# Patient Record
Sex: Female | Born: 1945 | Race: Black or African American | Hispanic: No | Smoking: Former smoker
Health system: Southern US, Community
[De-identification: ages and names within clinical notes are randomized; demographics above are authoritative.]

## PROBLEM LIST (undated history)

## (undated) DIAGNOSIS — K859 Acute pancreatitis without necrosis or infection, unspecified: Secondary | ICD-10-CM

## (undated) DIAGNOSIS — E785 Hyperlipidemia, unspecified: Secondary | ICD-10-CM

## (undated) DIAGNOSIS — E119 Type 2 diabetes mellitus without complications: Secondary | ICD-10-CM

## (undated) DIAGNOSIS — I10 Essential (primary) hypertension: Secondary | ICD-10-CM

## (undated) DIAGNOSIS — F419 Anxiety disorder, unspecified: Secondary | ICD-10-CM

## (undated) DIAGNOSIS — D649 Anemia, unspecified: Secondary | ICD-10-CM

## (undated) DIAGNOSIS — M1990 Unspecified osteoarthritis, unspecified site: Secondary | ICD-10-CM

## (undated) HISTORY — PX: OTHER SURGICAL HISTORY: SHX169

## (undated) HISTORY — DX: Type 2 diabetes mellitus without complications: E11.9

---

## 2001-03-01 ENCOUNTER — Encounter: Payer: Self-pay | Admitting: Obstetrics and Gynecology

## 2001-03-01 ENCOUNTER — Ambulatory Visit (HOSPITAL_COMMUNITY): Admission: RE | Admit: 2001-03-01 | Discharge: 2001-03-01 | Payer: Self-pay | Admitting: Obstetrics and Gynecology

## 2003-02-01 ENCOUNTER — Encounter: Payer: Self-pay | Admitting: Specialist

## 2003-02-01 ENCOUNTER — Ambulatory Visit (HOSPITAL_COMMUNITY): Admission: RE | Admit: 2003-02-01 | Discharge: 2003-02-01 | Payer: Self-pay | Admitting: Specialist

## 2006-10-28 ENCOUNTER — Ambulatory Visit: Payer: Self-pay | Admitting: General Surgery

## 2007-06-01 ENCOUNTER — Ambulatory Visit: Payer: Self-pay | Admitting: Family Medicine

## 2010-02-08 ENCOUNTER — Inpatient Hospital Stay: Payer: Self-pay | Admitting: Internal Medicine

## 2010-06-27 ENCOUNTER — Ambulatory Visit: Payer: Self-pay | Admitting: Family Medicine

## 2011-05-26 ENCOUNTER — Ambulatory Visit: Payer: Self-pay

## 2011-05-26 IMAGING — CT CT ABD-PELV W/ CM
1 of 2 series · 15 of 32 positions shown, 19 images · non-contrast
Comparison: none

REASON FOR EXAM: (1) abd pain; (2) pel pain
COMMENTS:

PROCEDURE:     CT  - CT ABDOMEN / PELVIS  W  - February 08, 2010  [DATE]
RESULT:
TECHNIQUE: Helical 3 mm sections were obtained from the lung bases through
the pubic symphysis status post intravenous administration of 100 mL of
Wsovue-FYO. The patient also received oral contrast.

[Series 2: 3mm soft tissue · axial · 0.70mm/px · z∈[-1172,-728]mm · 15 of 162 slices shown, 19 images]
[im 7/162  soft-tissue]
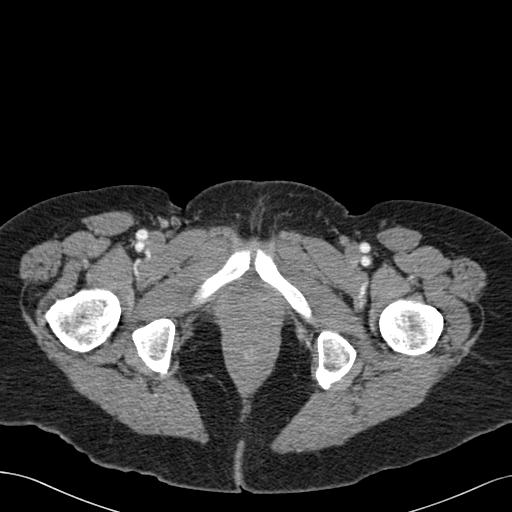
[im 7/162  bone]
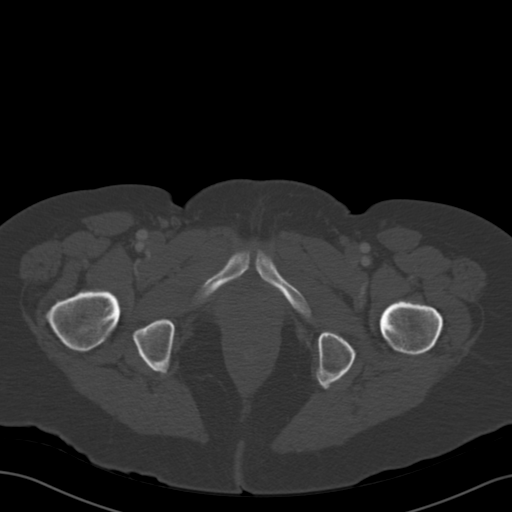
[im 20/162  soft-tissue]
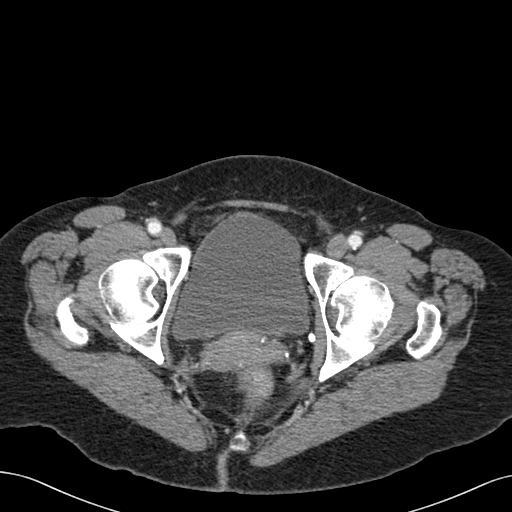
[im 33/162  soft-tissue]
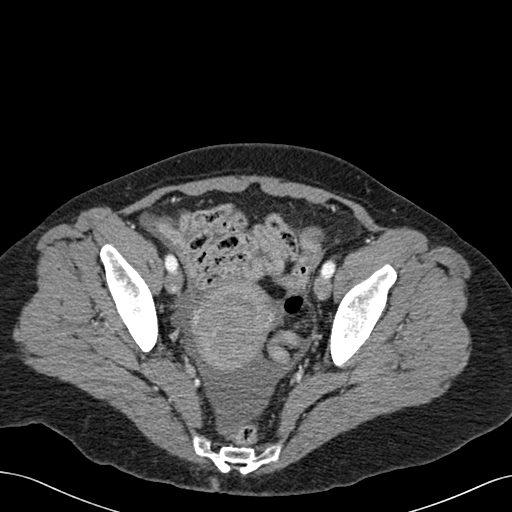
[im 46/162  soft-tissue]
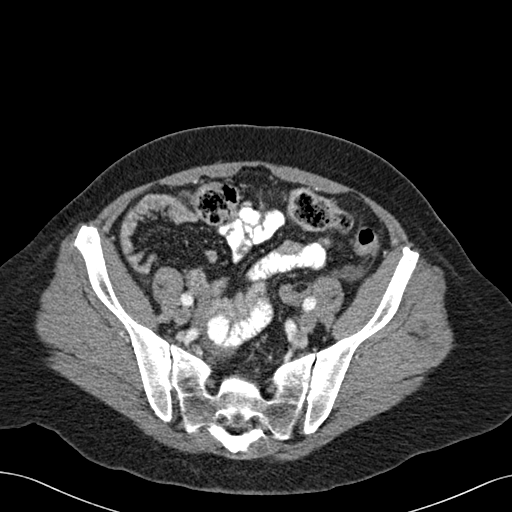
[im 58/162  soft-tissue]
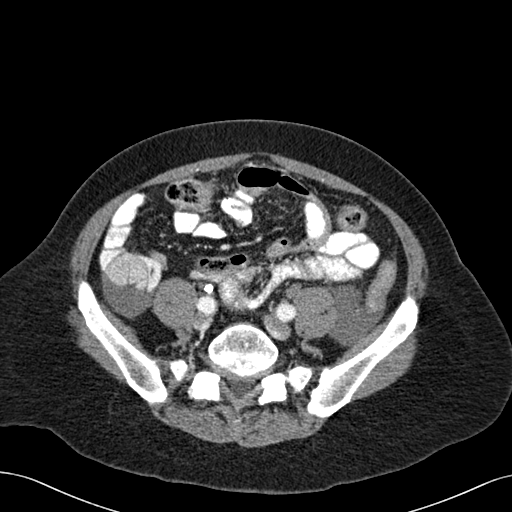
[im 71/162  soft-tissue]
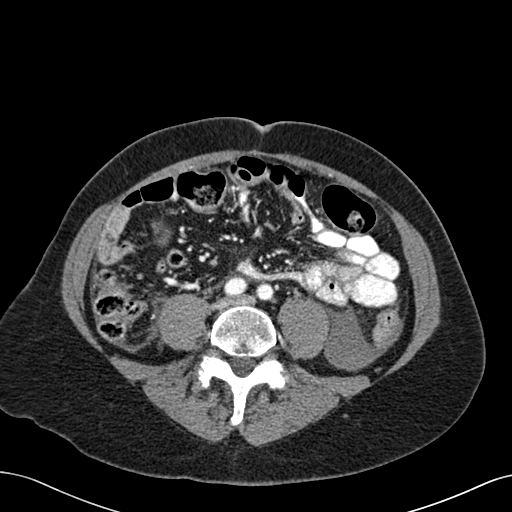
[im 84/162  soft-tissue]
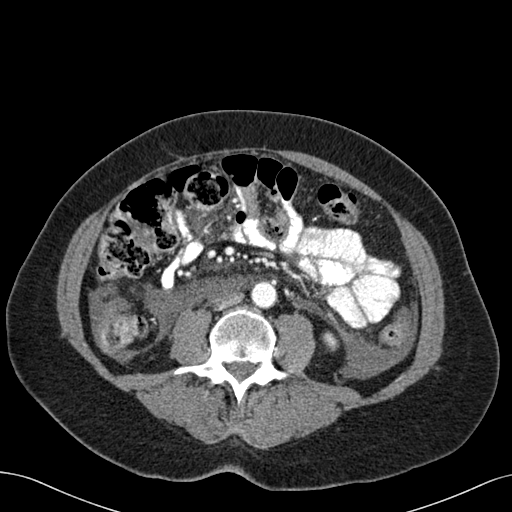
[im 91/162  soft-tissue]
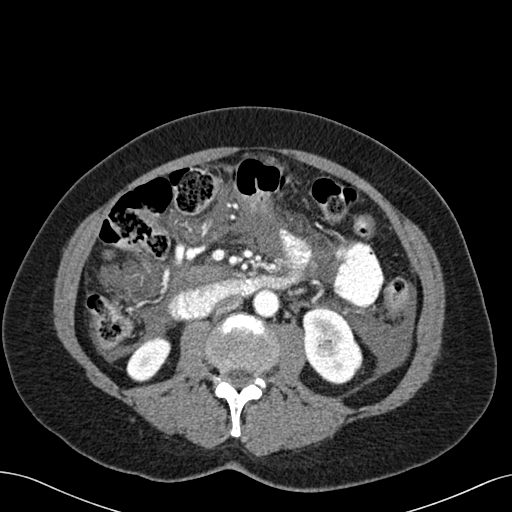
[im 104/162  soft-tissue]
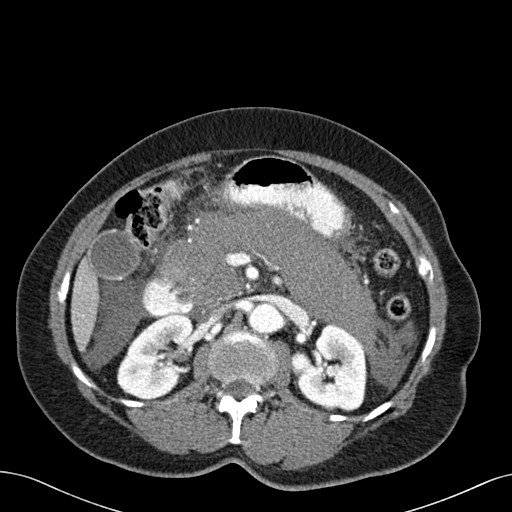
[im 104/162  bone]
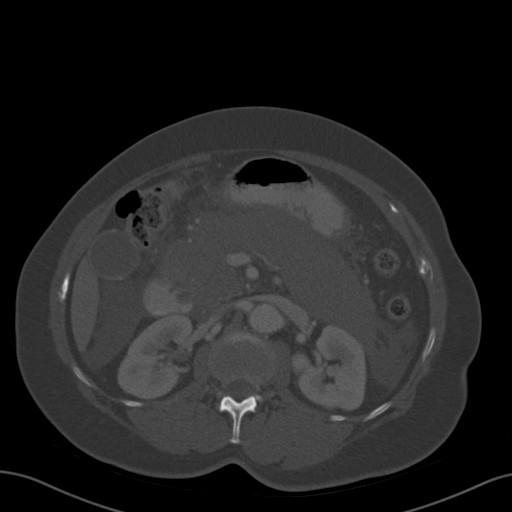
[im 116/162  soft-tissue]
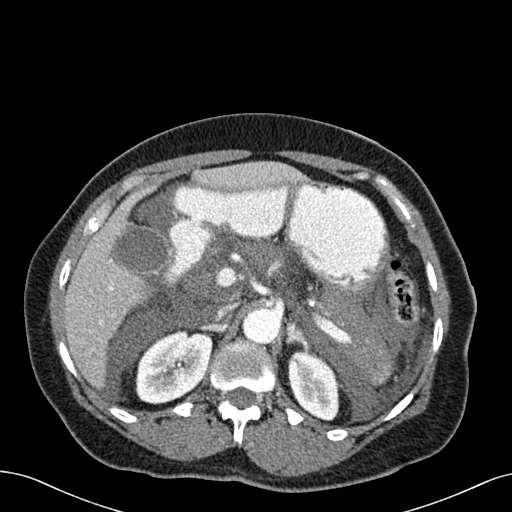
[im 129/162  soft-tissue]
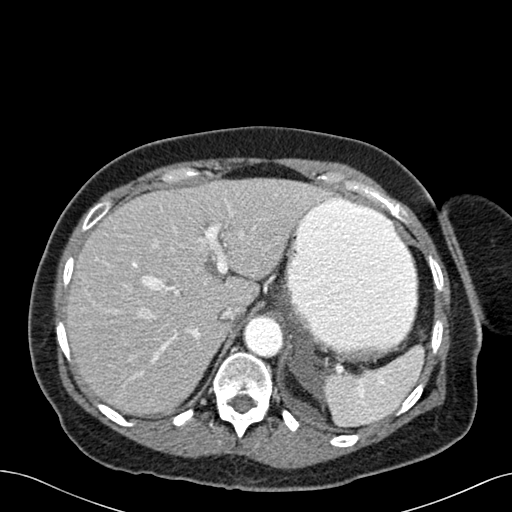
[im 136/162  lung]
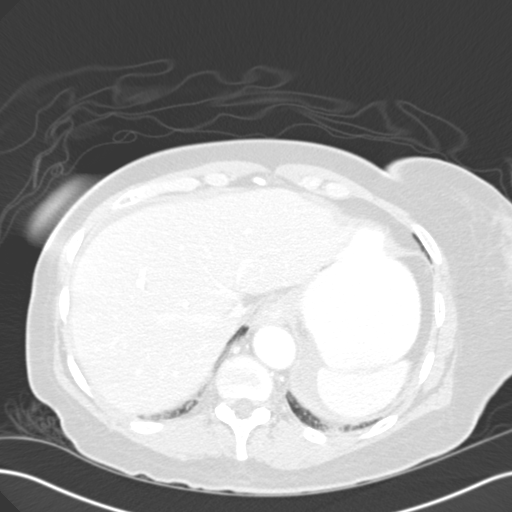
[im 142/162  soft-tissue]
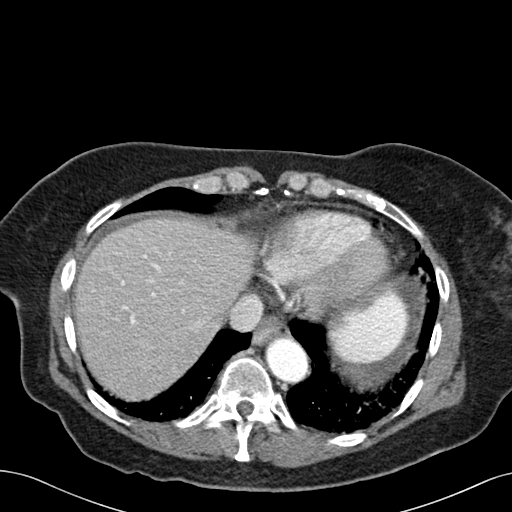
[im 142/162  lung]
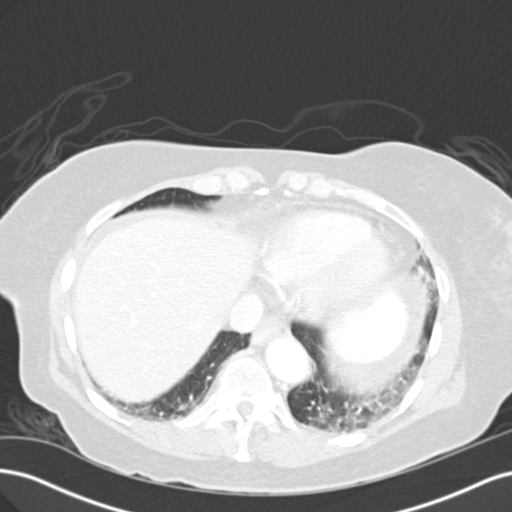
[im 149/162  lung]
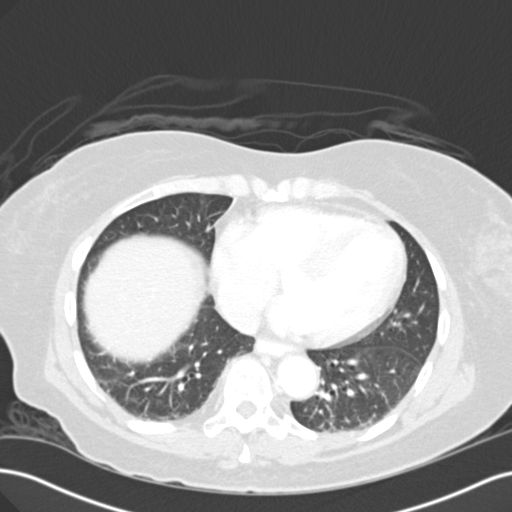
[im 155/162  soft-tissue]
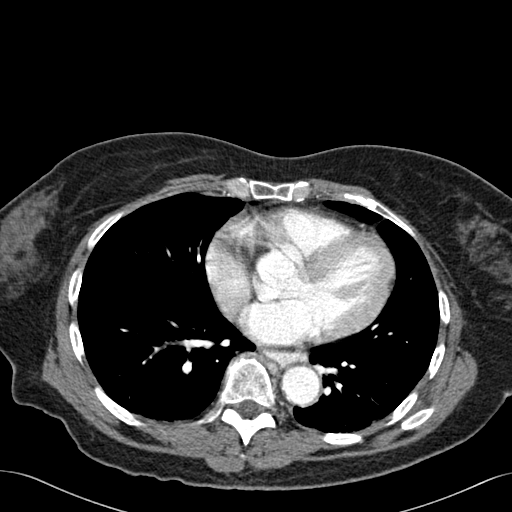
[im 155/162  lung]
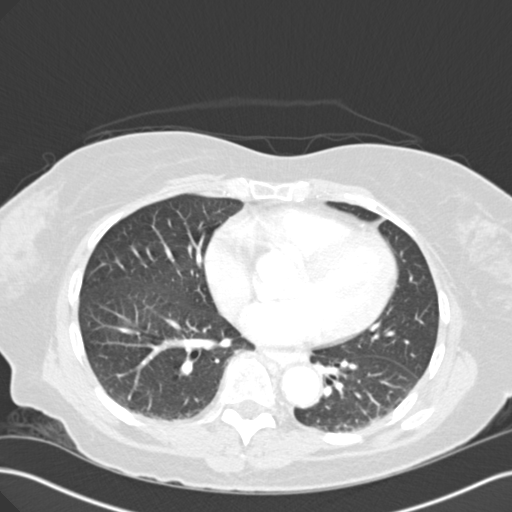

[15 of 32 positions shown; findings below may reference images not displayed]

FINDINGS: The lung bases demonstrate mild hyperinflation within the
posterior lung bases and otherwise grossly unremarkable.

Evaluation of the pancreas demonstrates diffuse enlargement of the pancreas
with loss of the normal undulating border. No focal drainable loculated
fluid collections are associated with the pancreas. There is diffuse
hypoattenuation throughout the pancreas. Diffuse free fluid is appreciated
surrounding the pancreas as well as areas of induration within the
peripancreatic fat. The fluid extends to the pelvic region and projects
within the cul-de-sac.

The liver, spleen, adrenals and kidneys are unremarkable. There is no
evidence of abdominal aortic aneurysm or dissection. The celiac, SMA, IMA,
portal vein and SMV are opacified. There does not appear to be evidence of
aneurysm formation within these vessels. There is no CT evidence of bowel
obstruction or secondary signs reflecting enteritis, colitis nor
diverticulitis. There is no evidence of appendicitis.
IMPRESSION: 1.     CT findings consistent with pancreatitis. There is hypo- enhancement
of the pancreas and an etiology such as evolving pancreatic necrosis cannot
be excluded. Surveillance evaluation recommended.
2.     There is no CT evidence of obstructive abnormalities or evidence of
pseudocyst formation.
3.     Dr. Zeephenx of the Emergency Department was informed of these
findings via preliminary fax report dated 02/08/10 at [DATE] p.m. CT.

## 2011-05-27 IMAGING — CR DG CHEST 1V PORT
1 series · 1 of 1 positions shown · non-contrast
Comparison: none

REASON FOR EXAM: Hypoxia
COMMENTS:

[view not recorded]
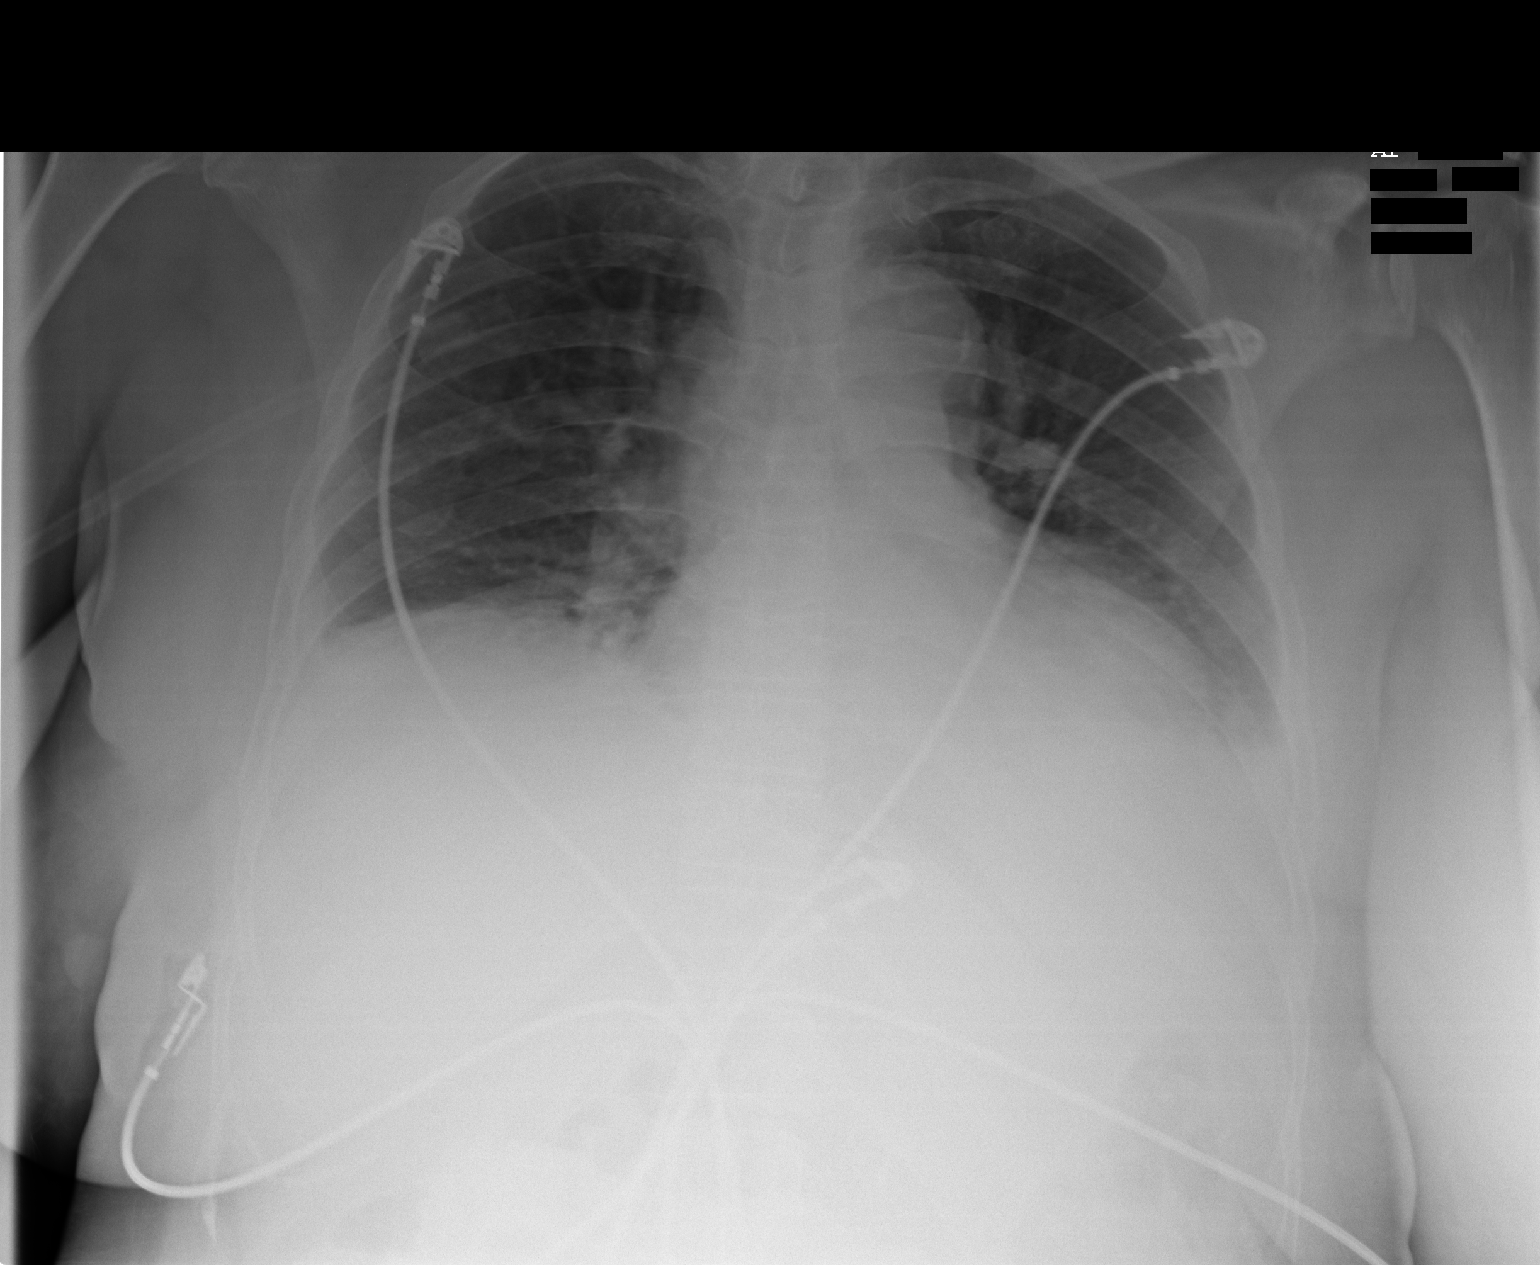

[1 of 1 positions shown; findings below may reference images not displayed]

PROCEDURE:     DXR - DXR PORTABLE CHEST SINGLE VIEW  - February 09, 2010 [DATE]

RESULT:     The inspiratory level is less than optimal. There is loss of
clarity of the left hemidiaphragm compatible with atelectasis and possibly a
trace, left effusion. The lung fields otherwise are clear. The heart size is
normal. The pulmonary vascularity is normal in appearance. Monitoring
electrodes are present.
IMPRESSION: There is thickening of the left basilar markings associated
with loss of clarity of the left hemidiaphragm. The findings are suspicious
for atelectasis and possibly a trace, left effusion.

## 2011-05-28 IMAGING — CR DG CHEST 1V PORT
1 series · 1 of 1 positions shown · non-contrast
Comparison: none

REASON FOR EXAM: line attempt
COMMENTS:

[view not recorded]
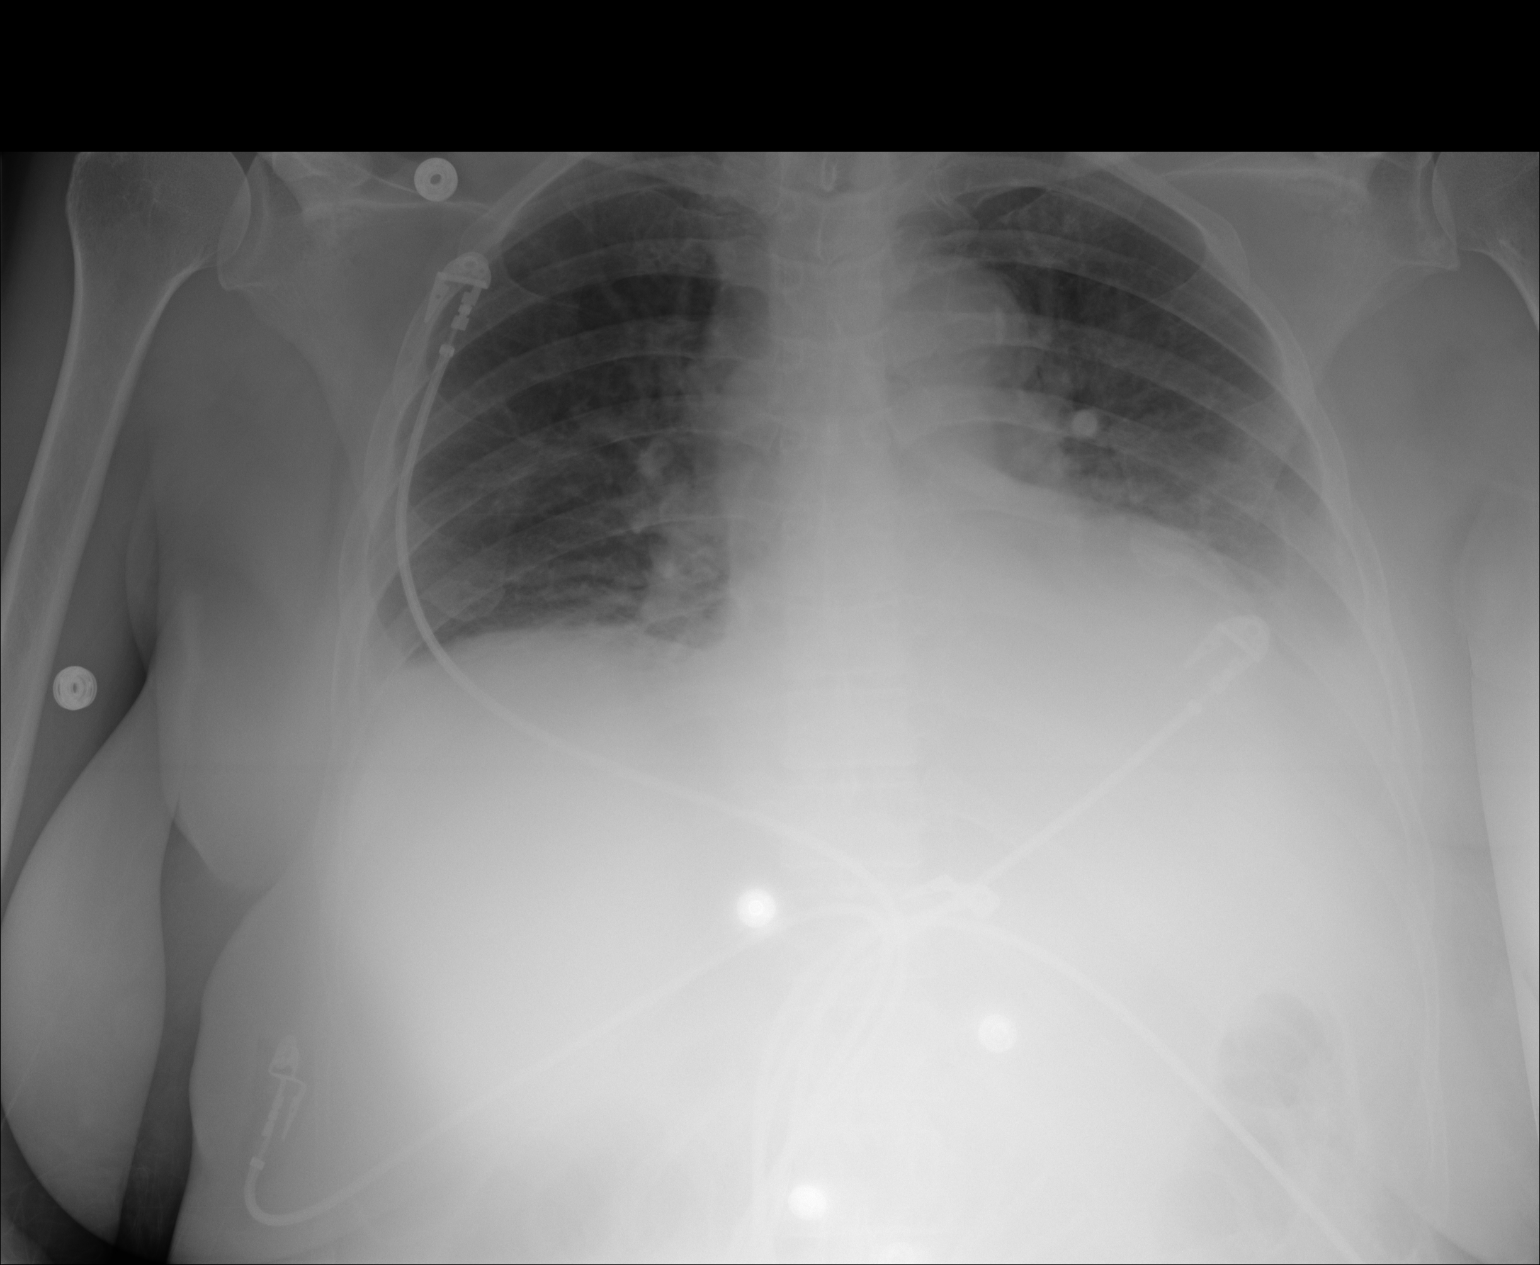

[1 of 1 positions shown; findings below may reference images not displayed]

PROCEDURE:     DXR - DXR PORTABLE CHEST SINGLE VIEW  - February 10, 2010  [DATE]

RESULT:     There is increased density at the left base, more prominent than
was evident on the exam of 02/09/2010. Pneumonia, atelectasis and effusion are
all considerations in the differential. Continued follow-up including a
lateral view is recommended when clinically feasible. The right lung field
remains clear. The heart size is within the limits of normal.
IMPRESSION: There has been a progressive increase in density at the left
base consistent with pneumonia, atelectasis or effusion. Continued follow-up
is recommended.

## 2011-06-06 ENCOUNTER — Ambulatory Visit: Payer: Self-pay

## 2013-08-31 ENCOUNTER — Ambulatory Visit (INDEPENDENT_AMBULATORY_CARE_PROVIDER_SITE_OTHER): Payer: Medicare Other | Admitting: Emergency Medicine

## 2013-08-31 VITALS — BP 132/86 | HR 63 | Temp 98.4°F | Resp 16 | Ht 67.0 in | Wt 168.0 lb

## 2013-08-31 DIAGNOSIS — H0019 Chalazion unspecified eye, unspecified eyelid: Secondary | ICD-10-CM

## 2013-08-31 DIAGNOSIS — E119 Type 2 diabetes mellitus without complications: Secondary | ICD-10-CM

## 2013-08-31 DIAGNOSIS — H0014 Chalazion left upper eyelid: Secondary | ICD-10-CM

## 2013-08-31 MED ORDER — OFLOXACIN 0.3 % OP SOLN: 1.0000 [drp] | OPHTHALMIC | Status: DC

## 2013-08-31 NOTE — Patient Instructions (Signed)
We will refer you to [facility] senior care You will also be referred to opthalmology we will call with these appointments Use warm compresses and eye drops Chalazion A chalazion is a swelling or hard lump on the eyelid caused by a blocked oil gland. Chalazions may occur on the upper or the lower eyelid.  CAUSES  Oil gland in the eyelid becomes blocked. SYMPTOMS   Swelling or hard lump on the eyelid. This lump may make it hard to see out of the eye.  The swelling may spread to areas around the eye. TREATMENT   Although some chalazions disappear by themselves in 1 or 2 months, some chalazions may need to be removed.  Medicines to treat an infection may be required. HOME CARE INSTRUCTIONS   Wash your hands often and dry them with a clean towel. Do not touch the chalazion.  Apply heat to the eyelid several times a day for 10 minutes to help ease discomfort and bring any yellowish white fluid (pus) to the surface. One way to apply heat to a chalazion is to use the handle of a metal spoon.  Hold the handle under hot water until it is hot, and then wrap the handle in paper towels so that the heat can come through without burning your skin.  Hold the wrapped handle against the chalazion and reheat the spoon handle as needed.  Apply heat in this fashion for 10 minutes, 4 times per day.  Return to your caregiver to have the pus removed if it does not break (rupture) on its own.  Do not try to remove the pus yourself by squeezing the chalazion or sticking it with a pin or needle.  Only take over-the-counter or prescription medicines for pain, discomfort, or fever as directed by your caregiver. SEEK IMMEDIATE MEDICAL CARE IF:   You have pain in your eye.  Your vision changes.  The chalazion does not go away.  The chalazion becomes painful, red, or swollen, grows larger, or does not start to disappear after 2 weeks. MAKE SURE YOU:   Understand these instructions.  Will watch your  condition.  Will get help right away if you are not doing well or get worse. Document Released: 09/18/2000 Document Revised: 12/14/2011 Document Reviewed: 01/06/2010 [facility] Patient Information 2014 [place], [place].

## 2013-08-31 NOTE — Progress Notes (Signed)
[facility] [address] [place] [address] [phone]  Date:  08/31/2013   Name:  HARBOR PASTER   DOB:  1946/05/30   MRN:  [id number]  PCP:  No PCP Per Patient    Chief Complaint: Annual Exam and needs labs   History of Present Illness:  Brooke Morse is a 67 y.o. very pleasant female patient who presents with the following:  Noted a progressively swelling mass on the left upper lid.  Does not affect vision.  No gluing or pain.  No visual complaints.  No history of injury.  No improvement with over the counter medications or other home remedies. Denies other complaint or health concern today.   There are no active problems to display for this patient.   Past Medical History  Diagnosis Date  . Diabetes mellitus without complication     Past Surgical History  Procedure Laterality Date  . Bunion removal      History  Substance Use Topics  . Smoking status: Never Smoker   . Smokeless tobacco: Not on file  . Alcohol Use: No    Family History  Problem Relation Age of Onset  . Cancer Father     No Known Allergies  Medication list has been reviewed and updated.  No current outpatient prescriptions on file prior to visit.   No current facility-administered medications on file prior to visit.    Review of Systems:  As per HPI, otherwise negative.    Physical Examination: Filed Vitals:   08/31/13 0947  BP: 132/86  Pulse: 63  Temp: 98.4 F (36.9 C)  Resp: 16   Filed Vitals:   08/31/13 0947  Height: 5\' 7"  (1.702 m)  Weight: 168 lb (76.204 kg)   Body mass index is 26.31 kg/(m^2). Ideal Body Weight: Weight in (lb) to have BMI = 25: 159.3   GEN: WDWN, NAD, Non-toxic, Alert & Oriented x 3 HEENT: Atraumatic, Normocephalic.  Ears and Nose: No external deformity. EXTR: No clubbing/cyanosis/edema NEURO: Normal gait.  PSYCH: Normally interactive. Conversant. Not depressed or anxious appearing.  Calm demeanor.  LEFT eye:  Upper  lid large chalazion   Assessment and Plan: Chalazion Follow up with opth [facility]   Signed,  Phillips Odor, MD

## 2013-09-04 ENCOUNTER — Ambulatory Visit: Payer: Self-pay | Admitting: Podiatry

## 2013-09-11 ENCOUNTER — Ambulatory Visit (INDEPENDENT_AMBULATORY_CARE_PROVIDER_SITE_OTHER): Payer: Medicare Other | Admitting: Podiatry

## 2013-09-11 ENCOUNTER — Encounter: Payer: Self-pay | Admitting: Podiatry

## 2013-09-11 VITALS — BP 138/80 | HR 67 | Resp 16 | Ht 67.0 in | Wt 165.0 lb

## 2013-09-11 DIAGNOSIS — M2010 Hallux valgus (acquired), unspecified foot: Secondary | ICD-10-CM

## 2013-09-11 DIAGNOSIS — M79609 Pain in unspecified limb: Secondary | ICD-10-CM

## 2013-09-11 NOTE — Progress Notes (Signed)
Brooke Morse presents today with a chief complaint of a painful bunion left. She relates that it is become more painful over the past several months and is beginning to inhibit her ability to perform her daily routine and continue her exercise regimen to control her diabetes hypertension and weight. She has tried conservative therapy such as wider shoes and anti-inflammatories to no avail.  Objective: Vital signs are stable she is alert and oriented x3. I have reviewed her past medical history medications and allergies. Vital signs are stable she is alert and oriented x3. Pulses are strongly palpable bilateral. Hallux abductovalgus deformity of the left foot is noted with an increase in the first intermetatarsal angle greater than is normal parameters as well as the hallux abductus angle. 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 I have reviewed her radiographs indicative of hallux abductovalgus deformity.  Assessment: Hallux valgus left.  Plan: We discussed the etiology pathology conservative versus surgical therapies at this point we are consenting her today for a an [facility] bunion repair with screw fixation left foot. I answered all the questions to regarding these procedures to the best of my ability is in layman's terms. She understood this was amenable to it signed Dr. pages of the consent form. She was dispensed a Cam Walker which will be use at the time of surgery and I will followup with her next week.

## 2013-10-09 ENCOUNTER — Ambulatory Visit: Payer: Self-pay | Admitting: Podiatry

## 2013-10-13 ENCOUNTER — Encounter: Payer: Self-pay | Admitting: Podiatry

## 2013-10-13 DIAGNOSIS — M2010 Hallux valgus (acquired), unspecified foot: Secondary | ICD-10-CM

## 2013-10-18 ENCOUNTER — Ambulatory Visit (INDEPENDENT_AMBULATORY_CARE_PROVIDER_SITE_OTHER): Payer: Medicare Other

## 2013-10-18 ENCOUNTER — Ambulatory Visit (INDEPENDENT_AMBULATORY_CARE_PROVIDER_SITE_OTHER): Payer: Medicare Other | Admitting: Podiatry

## 2013-10-18 ENCOUNTER — Encounter: Payer: Self-pay | Admitting: Podiatry

## 2013-10-18 VITALS — BP 126/75 | HR 77 | Temp 97.0°F | Resp 16 | Ht 67.0 in | Wt 165.0 lb

## 2013-10-18 DIAGNOSIS — Z9889 Other specified postprocedural states: Secondary | ICD-10-CM

## 2013-10-18 NOTE — Progress Notes (Signed)
Brooke Morse presents today one week status post [facility] bunion repair left foot. She denies fever chills nausea vomiting muscle aches pains and states it looks very good.  Objective: Vital signs are stable she is alert and oriented x3. Dry sterile dressing intact once removed demonstrates minimal edema no erythema saline is drainage or odor. Sutures are intact margins are well coapted and she has a good range of motion of the first metatarsophalangeal joint left foot. Radiographs confirm capital fragment in good position with screw fixation.  Assessment: Well-healing surgical foot status post 5 days [facility] bunion repair left.  Plan: Redressed today with a dry sterile compressive dressing. Continue to keep dry. Ice and elevate and continue physical therapy.

## 2013-10-19 ENCOUNTER — Encounter: Payer: Medicare Other | Admitting: Podiatry

## 2013-10-23 NOTE — Progress Notes (Signed)
1. [facility] bunionectomy repair with screw left foot.  Percocet 10/325 #50 0 refills take one to two every 6 - 8 hrs for pain. Phenergan 25mg  #30 0 refills one by mouth every 6 - 8 hrs for nausea Keflex 500 mg #20  0 refills one by mouth bid

## 2013-10-25 ENCOUNTER — Encounter: Payer: Self-pay | Admitting: Podiatry

## 2013-10-25 ENCOUNTER — Ambulatory Visit (INDEPENDENT_AMBULATORY_CARE_PROVIDER_SITE_OTHER): Payer: Medicare Other | Admitting: Podiatry

## 2013-10-25 VITALS — BP 143/81 | HR 80 | Temp 98.5°F | Resp 16

## 2013-10-25 DIAGNOSIS — Z9889 Other specified postprocedural states: Secondary | ICD-10-CM

## 2013-10-25 NOTE — Progress Notes (Signed)
Left foot bunion repair , pt states its ok .  Objective: Vital signs are stable she is alert and oriented x3. Dry sterile dressing was removed from the left foot. She has a good range of motion the first metatarsophalangeal joint. No erythema edema saline is drainage or odor. Sutures are intact margins are well coapted we removed the sutures today.  Assessment: Well-healing surgical foot status post 2 weeks [facility] bunion repair left.  Plan: Discussed etiology pathology conservative versus surgical therapies. At this point we will put her in a compression anklet allow her to wash this get wet and apply lotion and cream as. I encouraged range of motion exercises we put her in a compression anklet and a Darco shoe I will followup with her in 2 weeks for another set of x-rays.

## 2013-11-08 ENCOUNTER — Ambulatory Visit (INDEPENDENT_AMBULATORY_CARE_PROVIDER_SITE_OTHER): Payer: Medicare Other

## 2013-11-08 ENCOUNTER — Encounter: Payer: Self-pay | Admitting: Podiatry

## 2013-11-08 ENCOUNTER — Ambulatory Visit (INDEPENDENT_AMBULATORY_CARE_PROVIDER_SITE_OTHER): Payer: Medicare Other | Admitting: Podiatry

## 2013-11-08 VITALS — BP 140/89 | HR 85 | Resp 16 | Ht 67.0 in | Wt 165.0 lb

## 2013-11-08 DIAGNOSIS — Z9889 Other specified postprocedural states: Secondary | ICD-10-CM

## 2013-11-08 NOTE — Progress Notes (Signed)
She presents today for followup of her [facility] bunion repair left foot she is proximally 4 weeks out now and is doing quite well. She presents in her Darco shoe.  Objective: Vital signs are stable she is alert and oriented x3. Much decrease in edema from last visit. She is a good range of motion of the first metatarsophalangeal joint left. Small amount of hypopigmentation along the incision site. There is no scar hypertrophy. Radiographic evaluation demonstrates a well healing surgical foot capital fragment is in good position and screw fixation is maintained.  Assessment: Well-healing surgical foot status post 1 month.  Plan: Get back into Reglan shoe gear I will followup with her in 2 weeks for another set of x-rays.

## 2013-11-22 ENCOUNTER — Ambulatory Visit (INDEPENDENT_AMBULATORY_CARE_PROVIDER_SITE_OTHER): Payer: Medicare Other

## 2013-11-22 ENCOUNTER — Ambulatory Visit (INDEPENDENT_AMBULATORY_CARE_PROVIDER_SITE_OTHER): Payer: Medicare Other | Admitting: Podiatry

## 2013-11-22 VITALS — BP 140/87 | HR 76 | Resp 16 | Ht 67.0 in | Wt 165.0 lb

## 2013-11-22 DIAGNOSIS — Z9889 Other specified postprocedural states: Secondary | ICD-10-CM

## 2013-11-22 NOTE — Progress Notes (Signed)
She presents today for followup [facility] bunion repair left foot. She states is doing very well. Denies fever chills nausea vomiting muscle aches and pains. Denies any trauma. States that she has continued her range of motion exercises.  Objective: Vital signs are stable she is alert and oriented x3. She has minimal edema no erythema saline is drainage or odor scar tissue appears to be healing quite nicely has a good range of motion on dorsiflexion and plantarflexion pain-free. Radiographic evaluation demonstrates well-healing surgical foot.  Assessment: Well-healing surgical foot left status post [facility] bunion repair last month.  Plan: Increase range of motion activity and get back to her regular shoe gear. Followup with her in one month

## 2013-12-25 ENCOUNTER — Encounter: Payer: Medicare Other | Admitting: Podiatry

## 2013-12-27 ENCOUNTER — Ambulatory Visit (INDEPENDENT_AMBULATORY_CARE_PROVIDER_SITE_OTHER): Payer: Medicare Other

## 2013-12-27 ENCOUNTER — Ambulatory Visit (INDEPENDENT_AMBULATORY_CARE_PROVIDER_SITE_OTHER): Payer: Medicare Other | Admitting: Podiatry

## 2013-12-27 ENCOUNTER — Encounter: Payer: Self-pay | Admitting: Podiatry

## 2013-12-27 VITALS — BP 140/86 | HR 72 | Resp 16

## 2013-12-27 DIAGNOSIS — Z9889 Other specified postprocedural states: Secondary | ICD-10-CM

## 2013-12-27 NOTE — Progress Notes (Signed)
Post op [facility] bun left foot , doing good.  Objective: Vital signs are stable she is alert oriented x3. Pulses are strongly palpable left foot. Mild edema and scar tissue surrounding the first metatarsophalangeal joint but good range of motion on dorsiflexion and plantar flexion. Radiographic evaluation demonstrates a well healing surgical osteotomy.  Assessment: Well-healing surgical foot left.  Plan: Followup with me on an as-needed basis only.

## 2014-07-30 ENCOUNTER — Ambulatory Visit (INDEPENDENT_AMBULATORY_CARE_PROVIDER_SITE_OTHER): Payer: Medicare Other | Admitting: Podiatry

## 2014-07-30 ENCOUNTER — Encounter: Payer: Self-pay | Admitting: Podiatry

## 2014-07-30 ENCOUNTER — Ambulatory Visit (INDEPENDENT_AMBULATORY_CARE_PROVIDER_SITE_OTHER): Payer: Medicare Other

## 2014-07-30 VITALS — BP 148/87 | HR 72 | Resp 16

## 2014-07-30 DIAGNOSIS — M2012 Hallux valgus (acquired), left foot: Secondary | ICD-10-CM

## 2014-07-30 DIAGNOSIS — M21612 Bunion of left foot: Secondary | ICD-10-CM

## 2014-07-30 DIAGNOSIS — M778 Other enthesopathies, not elsewhere classified: Secondary | ICD-10-CM

## 2014-07-30 DIAGNOSIS — M779 Enthesopathy, unspecified: Secondary | ICD-10-CM

## 2014-07-30 DIAGNOSIS — M7752 Other enthesopathy of left foot: Secondary | ICD-10-CM

## 2014-07-30 NOTE — Progress Notes (Signed)
She presents today complaining of a painful bunion deformity of her left foot. It has been approximately 10 months since we performed an [facility] bunion repair of her left first metatarsophalangeal joint. She tolerated the procedure well and healed fine. She still has tenderness on ambulation with shoe gear. And anything that rubs the medial aspect of the first metatarsophalangeal joint. She denies any changes in her past medical history medications allergies surgeries and social history. She denies any trauma to the foot.  Objective: Pulses are strongly palpable bilateral vital signs are stable she is alert and oriented 3. She has a little tenderness on palpation and range of motion of the first metatarsophalangeal joint of the left foot. Radiographic evaluation of the left foot does demonstrate what appears to be soft tissue increase in density at the medial aspect of the first metatarsophalangeal joint. I demonstrated this to the patient she sees it and understands that her deformity is minimal. Then we did discuss the possibility of further resecting the hypertrophic medial condyle.  Assessment: Capsulitis first metatarsophalangeal joint left foot status post [facility] bunion repair left.  Plan: Injected dexamethasone and local anesthetic to the point of maximal tenderness left foot. I will follow up with her in approximately a month to 6 weeks.

## 2015-03-18 ENCOUNTER — Ambulatory Visit: Payer: Medicare Other | Admitting: Anesthesiology

## 2015-03-18 ENCOUNTER — Encounter
Admission: RE | Disposition: A | Discharge status: Home / Self Care | Payer: Self-pay | Source: Ambulatory Visit | Attending: Gastroenterology

## 2015-03-18 ENCOUNTER — Ambulatory Visit
Admission: RE | Admit: 2015-03-18 | Discharge: 2015-03-18 | Disposition: A | Discharge status: Home / Self Care | Payer: Medicare Other | Source: Ambulatory Visit | Attending: Gastroenterology | Admitting: Gastroenterology

## 2015-03-18 DIAGNOSIS — E119 Type 2 diabetes mellitus without complications: Secondary | ICD-10-CM | POA: Insufficient documentation

## 2015-03-18 DIAGNOSIS — Z86010 Personal history of colonic polyps: Secondary | ICD-10-CM | POA: Diagnosis not present

## 2015-03-18 DIAGNOSIS — Z79899 Other long term (current) drug therapy: Secondary | ICD-10-CM | POA: Diagnosis not present

## 2015-03-18 DIAGNOSIS — Z1211 Encounter for screening for malignant neoplasm of colon: Secondary | ICD-10-CM | POA: Diagnosis present

## 2015-03-18 DIAGNOSIS — D125 Benign neoplasm of sigmoid colon: Secondary | ICD-10-CM | POA: Insufficient documentation

## 2015-03-18 DIAGNOSIS — Z800 Family history of malignant neoplasm of digestive organs: Secondary | ICD-10-CM | POA: Insufficient documentation

## 2015-03-18 DIAGNOSIS — Z794 Long term (current) use of insulin: Secondary | ICD-10-CM | POA: Insufficient documentation

## 2015-03-18 DIAGNOSIS — E785 Hyperlipidemia, unspecified: Secondary | ICD-10-CM | POA: Diagnosis not present

## 2015-03-18 HISTORY — PX: COLONOSCOPY WITH PROPOFOL: SHX5780

## 2015-03-18 HISTORY — DX: Hyperlipidemia, unspecified: E78.5

## 2015-03-18 LAB — GLUCOSE, CAPILLARY: Glucose-Capillary: 112 mg/dL — ABNORMAL HIGH (ref 65–99)

## 2015-03-18 SURGERY — COLONOSCOPY WITH PROPOFOL
Anesthesia: General

## 2015-03-18 MED ORDER — LIDOCAINE HCL (CARDIAC) 20 MG/ML IV SOLN
INTRAVENOUS | Status: DC | PRN
  Administered 2015-03-18: 60 mg via INTRAVENOUS

## 2015-03-18 MED ORDER — SODIUM CHLORIDE 0.9 % IV SOLN
INTRAVENOUS | Status: DC
  Administered 2015-03-18: 1000 mL via INTRAVENOUS

## 2015-03-18 MED ORDER — MIDAZOLAM HCL 2 MG/2ML IJ SOLN
INTRAMUSCULAR | Status: DC | PRN
  Administered 2015-03-18: 2 mg via INTRAVENOUS

## 2015-03-18 MED ORDER — PROPOFOL INFUSION 10 MG/ML OPTIME
INTRAVENOUS | Status: DC | PRN
  Administered 2015-03-18: 120 ug/kg/min via INTRAVENOUS

## 2015-03-18 MED ORDER — SODIUM CHLORIDE 0.9 % IV SOLN: INTRAVENOUS | Status: DC

## 2015-03-18 MED ORDER — FENTANYL CITRATE (PF) 100 MCG/2ML IJ SOLN
INTRAMUSCULAR | Status: DC | PRN
  Administered 2015-03-18: 50 ug via INTRAVENOUS

## 2015-03-18 NOTE — Anesthesia Preprocedure Evaluation (Signed)
Anesthesia Evaluation  Patient identified by MRN, date of birth, ID band Patient awake    Reviewed: Allergy & Precautions, H&P , NPO status , Patient's Chart, lab work & pertinent test results, reviewed documented beta blocker date and time   Airway Mallampati: II  TM Distance: >3 FB Neck ROM: full    Dental no notable dental hx.    Pulmonary neg pulmonary ROS,  breath sounds clear to auscultation  Pulmonary exam normal       Cardiovascular Exercise Tolerance: Good negative cardio ROS  Rhythm:regular Rate:Normal     Neuro/Psych negative neurological ROS  negative psych ROS   GI/Hepatic negative GI ROS, Neg liver ROS,   Endo/Other  negative endocrine ROSdiabetes  Renal/GU negative Renal ROS  negative genitourinary   Musculoskeletal   Abdominal   Peds  Hematology negative hematology ROS (+)   Anesthesia Other Findings   Reproductive/Obstetrics negative OB ROS                             Anesthesia Physical Anesthesia Plan  ASA: II  Anesthesia Plan: General   Post-op Pain Management:    Induction:   Airway Management Planned:   Additional Equipment:   Intra-op Plan:   Post-operative Plan:   Informed Consent: I have reviewed the patients History and Physical, chart, labs and discussed the procedure including the risks, benefits and alternatives for the proposed anesthesia with the patient or authorized representative who has indicated his/her understanding and acceptance.   Dental Advisory Given  Plan Discussed with: CRNA  Anesthesia Plan Comments:         Anesthesia Quick Evaluation

## 2015-03-18 NOTE — Op Note (Signed)
[facility] Gastroenterology Patient Name: Brooke Morse Procedure Date: 03/18/2015 9:13 AM MRN: [id number] Account #: [id number] Date of Birth: Jul 24, 1946 Admit Type: Outpatient Age: 69 Room: [facility] ENDO ROOM 4 Gender: Female Note Status: Finalized Procedure:         Colonoscopy Indications:       Family history of colon cancer in a first-degree relative,                     Personal history of colonic polyps Providers:         Lupita Dawn. Candace Cruise, MD Referring MD:      Earleen Newport Eddie Dibbles, MD (Referring MD) Medicines:         Monitored Anesthesia Care Complications:     No immediate complications. Procedure:         Pre-Anesthesia Assessment:                    - Prior to the procedure, a History and Physical was                     performed, and patient medications, allergies and                     sensitivities were reviewed. The patient's tolerance of                     previous anesthesia was reviewed.                    - The risks and benefits of the procedure and the sedation                     options and risks were discussed with the patient. All                     questions were answered and informed consent was obtained.                    - After reviewing the risks and benefits, the patient was                     deemed in satisfactory condition to undergo the procedure.                    After obtaining informed consent, the colonoscope was                     passed under direct vision. Throughout the procedure, the                     patient's blood pressure, pulse, and oxygen saturations                     were monitored continuously. The Colonoscope was                     introduced through the anus and advanced to the the cecum,                     identified by appendiceal orifice and ileocecal valve. The                     colonoscopy was performed without difficulty. The patient  tolerated the procedure well. The quality of the  bowel                     preparation was fair. Findings:      A small polyp was found in the sigmoid colon. The polyp was sessile. The       polyp was removed with a jumbo cold forceps. Resection and retrieval       were complete.      The exam was otherwise without abnormality. Impression:        - One small polyp in the sigmoid colon. Resected and                     retrieved.                    - The examination was otherwise normal. Recommendation:    - Discharge patient to home.                    - Await pathology results.                    - Repeat colonoscopy in 5 years for surveillance based on                     pathology results.                    - The findings and recommendations were discussed with the                     patient. Procedure Code(s): --- Professional ---                    [phone], Colonoscopy, flexible; with biopsy, single or                     multiple Diagnosis Code(s): --- Professional ---                    D12.5, Benign neoplasm of sigmoid colon                    Z80.0, Family history of malignant neoplasm of digestive                     organs                    Z86.010, Personal history of colonic polyps CPT copyright 2014 [employer]. All rights reserved. The codes documented in this report are preliminary and upon coder review may  be revised to meet current compliance requirements. Hulen Luster, MD 03/18/2015 9:41:42 AM This report has been signed electronically. Number of Addenda: 0 Note Initiated On: 03/18/2015 9:13 AM Scope Withdrawal Time: 0 hours 10 minutes 13 seconds  Total Procedure Duration: 0 hours 14 minutes 48 seconds       [facility]

## 2015-03-18 NOTE — Transfer of Care (Signed)
Immediate Anesthesia Transfer of Care Note  Patient: Brooke Morse  Procedure(s) Performed: Procedure(s): COLONOSCOPY WITH PROPOFOL (N/A)  Patient Location: PACU and Endoscopy Unit  Anesthesia Type:General  Level of Consciousness: awake, alert  and oriented  Airway & Oxygen Therapy: Patient Spontanous Breathing and Patient connected to nasal cannula oxygen  Post-op Assessment: Report given to RN and Post -op Vital signs reviewed and stable  Post vital signs: Reviewed and stable  Last Vitals:  Filed Vitals:   03/18/15 0858  BP: 142/78  Pulse: 54  Temp: 36.4 C  Resp: 16    Complications: No apparent anesthesia complications

## 2015-03-18 NOTE — H&P (Signed)
    Primary Care Physician:  Adella Hare, MD Primary Gastroenterologist:  Dr. Candace Cruise  Pre-Procedure History & Physical: HPI:  Brooke Morse is a 69 y.o. female is here for a colonoscopy.   Past Medical History  Diagnosis Date  . Diabetes mellitus without complication   . Hyperlipemia     Past Surgical History  Procedure Laterality Date  . Bunion removal      Prior to Admission medications   Medication Sig Start Date End Date Taking? Authorizing Provider  atorvastatin (LIPITOR) 20 MG tablet Take 20 mg by mouth daily.   Yes Historical Provider, MD  Fiber, Guar Gum, CHEW Chew 1 tablet by mouth daily.   Yes Historical Provider, MD  insulin glargine (LANTUS) 100 UNIT/ML injection Inject 12 Units into the skin at bedtime.   Yes Historical Provider, MD  insulin lispro (HUMALOG) 100 UNIT/ML injection Inject 7-10 Units into the skin 3 (three) times daily with meals. Sliding scale   Yes Historical Provider, MD  magnesium gluconate (MAGONATE) 500 MG tablet Take 500 mg by mouth daily.   Yes Historical Provider, MD  Multiple Vitamin (MULTIVITAMIN WITH MINERALS) TABS tablet Take 1 tablet by mouth daily.   Yes Historical Provider, MD  NON FORMULARY Take 1 capsule by mouth daily. code liver oil   Yes Historical Provider, MD  simvastatin (ZOCOR) 20 MG tablet Take 1 tablet by mouth every evening. 03/20/14 03/20/15 Yes Historical Provider, MD  aspirin 81 MG tablet Take 81 mg by mouth daily.    Historical Provider, MD    Allergies as of 02/12/2015  . (No Known Allergies)    Family History  Problem Relation Age of Onset  . Cancer Father     History   Social History  . Marital Status: Divorced    Spouse Name: N/A  . Number of Children: N/A  . Years of Education: N/A   Occupational History  . Not on file.   Social History Main Topics  . Smoking status: Never Smoker   . Smokeless tobacco: Not on file  . Alcohol Use: No  . Drug Use: No  . Sexual Activity: Not on file   Other Topics  Concern  . Not on file   Social History Narrative    Review of Systems: See HPI, otherwise negative ROS  Physical Exam: BP 142/78 mmHg  Pulse 54  Temp(Src) 97.6 F (36.4 C) (Tympanic)  Resp 16  Ht 5\' 7"  (1.702 m)  Wt 73.483 kg (162 lb)  BMI 25.37 kg/m2  SpO2 100% General:   Alert,  pleasant and cooperative in NAD Head:  Normocephalic and atraumatic. Neck:  Supple; no masses or thyromegaly. Lungs:  Clear throughout to auscultation.    Heart:  Regular rate and rhythm. Abdomen:  Soft, nontender and nondistended. Normal bowel sounds, without guarding, and without rebound.   Neurologic:  Alert and  oriented x4;  grossly normal neurologically.  Impression/Plan: Brooke Morse is here for an colonoscopy to be performed for family hx of colon cancer and personal hx of colon polyps.  Risks, benefits, limitations, and alternatives regarding colonoscopy have been reviewed with the patient.  Questions have been answered.  All parties agreeable.   Brooke Morse, Brooke Dawn, MD  03/18/2015, 9:11 AM

## 2015-03-18 NOTE — Anesthesia Postprocedure Evaluation (Signed)
  Anesthesia Post-op Note  Patient: Brooke Morse  Procedure(s) Performed: Procedure(s): COLONOSCOPY WITH PROPOFOL (N/A)  Anesthesia type:General  Patient location: PACU  Post pain: Pain level controlled  Post assessment: Post-op Vital signs reviewed, Patient's Cardiovascular Status Stable, Respiratory Function Stable, Patent Airway and No signs of Nausea or vomiting  Post vital signs: Reviewed and stable  Last Vitals:  Filed Vitals:   03/18/15 1030  BP: 136/84  Pulse: 48  Temp:   Resp: 16    Level of consciousness: awake, alert  and patient cooperative  Complications: No apparent anesthesia complications

## 2015-03-19 LAB — SURGICAL PATHOLOGY

## 2015-05-09 ENCOUNTER — Encounter: Payer: Self-pay | Admitting: Gastroenterology

## 2016-02-24 DIAGNOSIS — E10649 Type 1 diabetes mellitus with hypoglycemia without coma: Secondary | ICD-10-CM | POA: Diagnosis not present

## 2016-02-27 DIAGNOSIS — E10649 Type 1 diabetes mellitus with hypoglycemia without coma: Secondary | ICD-10-CM | POA: Diagnosis not present

## 2016-02-27 DIAGNOSIS — E109 Type 1 diabetes mellitus without complications: Secondary | ICD-10-CM | POA: Diagnosis not present

## 2016-02-27 DIAGNOSIS — E785 Hyperlipidemia, unspecified: Secondary | ICD-10-CM | POA: Diagnosis not present

## 2016-03-04 DIAGNOSIS — E1065 Type 1 diabetes mellitus with hyperglycemia: Secondary | ICD-10-CM | POA: Diagnosis not present

## 2016-10-20 DIAGNOSIS — E785 Hyperlipidemia, unspecified: Secondary | ICD-10-CM | POA: Diagnosis not present

## 2016-10-20 DIAGNOSIS — E10649 Type 1 diabetes mellitus with hypoglycemia without coma: Secondary | ICD-10-CM | POA: Diagnosis not present

## 2016-10-20 DIAGNOSIS — E109 Type 1 diabetes mellitus without complications: Secondary | ICD-10-CM | POA: Diagnosis not present

## 2016-10-27 DIAGNOSIS — E119 Type 2 diabetes mellitus without complications: Secondary | ICD-10-CM | POA: Diagnosis not present

## 2016-11-17 DIAGNOSIS — E109 Type 1 diabetes mellitus without complications: Secondary | ICD-10-CM | POA: Diagnosis not present

## 2017-04-06 DIAGNOSIS — E1065 Type 1 diabetes mellitus with hyperglycemia: Secondary | ICD-10-CM | POA: Diagnosis not present

## 2017-04-13 DIAGNOSIS — E10649 Type 1 diabetes mellitus with hypoglycemia without coma: Secondary | ICD-10-CM | POA: Diagnosis not present

## 2017-04-19 DIAGNOSIS — E785 Hyperlipidemia, unspecified: Secondary | ICD-10-CM | POA: Diagnosis not present

## 2017-04-19 DIAGNOSIS — E10649 Type 1 diabetes mellitus with hypoglycemia without coma: Secondary | ICD-10-CM | POA: Diagnosis not present

## 2017-04-19 DIAGNOSIS — E109 Type 1 diabetes mellitus without complications: Secondary | ICD-10-CM | POA: Diagnosis not present

## 2017-06-28 DIAGNOSIS — E1065 Type 1 diabetes mellitus with hyperglycemia: Secondary | ICD-10-CM | POA: Diagnosis not present

## 2017-09-21 DIAGNOSIS — E1065 Type 1 diabetes mellitus with hyperglycemia: Secondary | ICD-10-CM | POA: Diagnosis not present

## 2017-11-05 DIAGNOSIS — E785 Hyperlipidemia, unspecified: Secondary | ICD-10-CM | POA: Diagnosis not present

## 2017-11-05 DIAGNOSIS — I10 Essential (primary) hypertension: Secondary | ICD-10-CM | POA: Diagnosis not present

## 2017-11-05 DIAGNOSIS — E10649 Type 1 diabetes mellitus with hypoglycemia without coma: Secondary | ICD-10-CM | POA: Diagnosis not present

## 2017-11-05 DIAGNOSIS — E109 Type 1 diabetes mellitus without complications: Secondary | ICD-10-CM | POA: Diagnosis not present

## 2017-11-30 ENCOUNTER — Encounter: Payer: Self-pay | Admitting: Primary Care

## 2017-11-30 ENCOUNTER — Ambulatory Visit (INDEPENDENT_AMBULATORY_CARE_PROVIDER_SITE_OTHER): Payer: Medicare Other | Admitting: Primary Care

## 2017-11-30 VITALS — BP 142/74 | HR 69 | Temp 98.0°F | Ht 66.5 in | Wt 157.5 lb

## 2017-11-30 DIAGNOSIS — I10 Essential (primary) hypertension: Secondary | ICD-10-CM

## 2017-11-30 DIAGNOSIS — E109 Type 1 diabetes mellitus without complications: Secondary | ICD-10-CM | POA: Diagnosis not present

## 2017-11-30 DIAGNOSIS — E785 Hyperlipidemia, unspecified: Secondary | ICD-10-CM | POA: Insufficient documentation

## 2017-11-30 NOTE — Progress Notes (Signed)
Subjective:    Patient ID: Brooke Morse, female    DOB: 02-14-1946, 72 y.o.   MRN: [id number]  HPI  Brooke Morse is a 72 year old female who presents today to establish care and discuss the problems mentioned below. Will obtain old records. Her last physical was several years ago.   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 Currently managed on Humalog Kwikpen and following with endocrinology. Last A1C of 7.3 in February 2019. She never had part of pancrease removed, did have a drain at one point.   2) Hyperlipidemia: Currently managed on atorvastatin 20 mg. Last lipid panel on file with TC of 168, LDL of 86 in January 2018. She denies myalgias.   3) Dizziness: Occurred one month ago, intermittently,  lasting three days. On day three she felt nauseated with one episode of vomiting. She hasn't noticed the dizziness since, denies prior history of vertigo or dizziness. Does have a history of cerumen impaction which required irrigation. She denies chest pain, dizziness, shortness of breath.   BP Readings from Last 3 Encounters:  11/30/17 (!) 142/74  03/18/15 136/84  07/30/14 (!) 148/87    She was once managed on an antihypertensive for high blood pressure readings, stopped taking 2 years ago. She does not check her blood pressure at home, she does not remember which medication she was taking in the past.   Review of Systems  Respiratory: Negative for shortness of breath.   Cardiovascular: Negative for chest pain.  Gastrointestinal: Negative for nausea.  Neurological: Negative for dizziness, weakness and headaches.       Past Medical History:  Diagnosis Date  . Diabetes mellitus without complication ([place])   . Hyperlipemia      Social History   Socioeconomic History  . Marital status: Divorced    Spouse name: Not on file  . Number of children: Not on file  . Years of education: Not on file  . Highest education level: Not on file  Social Needs    . Financial resource strain: Not on file  . Food insecurity - worry: Not on file  . Food insecurity - inability: Not on file  . Transportation needs - medical: Not on file  . Transportation needs - non-medical: Not on file  Occupational History  . Not on file  Tobacco Use  . Smoking status: Never Smoker  Substance and Sexual Activity  . Alcohol use: No  . Drug use: No  . Sexual activity: Not on file  Other Topics Concern  . Not on file  Social History Narrative   Divorced.   2 children, 5 grandchildren.   Retired. Once worked at a cigarette factory.   Enjoys sewing, cooking, walks 2 miles daily.     Past Surgical History:  Procedure Laterality Date  . bunion removal    . COLONOSCOPY WITH PROPOFOL N/A 03/18/2015   Procedure: COLONOSCOPY WITH PROPOFOL;  Surgeon: Hulen Luster, MD;  Location: [facility] ENDOSCOPY;  Service: Gastroenterology;  Laterality: N/A;    Family History  Problem Relation Age of Onset  . Colon cancer Father   . Asthma Father     No Known Allergies  Current Outpatient Medications on File Prior to Visit  Medication Sig Dispense Refill  . aspirin 81 MG tablet Take 81 mg by mouth daily.    Marland Kitchen atorvastatin (LIPITOR) 20 MG tablet Take 20 mg by mouth daily.    . Fiber, Guar Gum, CHEW Chew 1 tablet  by mouth daily.    . insulin lispro (HUMALOG KWIKPEN) 100 UNIT/ML KiwkPen Inject 12 Units as directed 3 (three) times daily.    . magnesium gluconate (MAGONATE) 500 MG tablet Take 500 mg by mouth daily.    . Multiple Vitamin (MULTIVITAMIN WITH MINERALS) TABS tablet Take 1 tablet by mouth daily.     No current facility-administered medications on file prior to visit.     BP (!) 142/74   Pulse 69   Temp 98 F (36.7 C) (Oral)   Ht 5' 6.5" (1.689 m)   Wt 157 lb 8 oz (71.4 kg)   SpO2 98%   BMI 25.04 kg/m    Objective:   Physical Exam  Constitutional: She is oriented to person, place, and time. She appears well-nourished.  HENT:  Right Ear: Tympanic membrane and  ear canal normal.  Left Ear: Tympanic membrane and ear canal normal.  Eyes: EOM are normal.  Neck: Neck supple.  Cardiovascular: Normal rate and regular rhythm.  Pulmonary/Chest: Effort normal and breath sounds normal.  Neurological: She is alert and oriented to person, place, and time.  Skin: Skin is warm and dry.  Psychiatric: She has a normal mood and affect.          Assessment & Plan:  Dizziness:  Occurred one month ago, intermittently lasting 3 days.  No symptoms since.  Could have been vertigo, mild illness, dehydration.  Exam today unremarkable, no cerumen impaction. She'll start monitoring BP regularly and report readings at or above 140/90. She'll also note if symptoms return.  Pleas Koch, NP

## 2017-11-30 NOTE — Assessment & Plan Note (Signed)
Managed on atorvastatin, no recent lipid panel on file. Lipids pending, she'll return when fasting.

## 2017-11-30 NOTE — Assessment & Plan Note (Signed)
Prior diagnosis, once managed on medication. BP slightly above goal. Will have her start monitoring and report readings at or above 140/90 on a consistent basis.

## 2017-11-30 NOTE — Assessment & Plan Note (Signed)
Following with endocrinology, recent A1C of 7.3. Continue current regimen.

## 2017-11-30 NOTE — Patient Instructions (Signed)
Start monitoring your blood pressure 3-4 times weekly, around the same time of day, for the next several weeks.  Ensure that you have rested for 30 minutes prior to checking your blood pressure. Record your readings and notify me of readings at or above 140/90.  Schedule a lab only appointment to return fasting for cholesterol check. You may have water and black coffee only.  Please schedule a Medicare Wellness Visit with our nurse and Physical with me anytime this year.   It was a pleasure to meet you today! Please don't hesitate to call or message me with any questions. Welcome to [facility]!

## 2017-12-07 ENCOUNTER — Other Ambulatory Visit (INDEPENDENT_AMBULATORY_CARE_PROVIDER_SITE_OTHER): Payer: Medicare Other

## 2017-12-07 DIAGNOSIS — E785 Hyperlipidemia, unspecified: Secondary | ICD-10-CM | POA: Diagnosis not present

## 2017-12-07 LAB — LIPID PANEL
CHOL/HDL RATIO: 2
Cholesterol: 175 mg/dL (ref 0–200)
HDL: 70.3 mg/dL (ref >39.00)
LDL CALC: 90 mg/dL (ref 0–99)
NonHDL: 104.76
TRIGLYCERIDES: 72 mg/dL (ref 0.0–149.0)
VLDL: 14.4 mg/dL (ref 0.0–40.0)

## 2018-01-15 ENCOUNTER — Other Ambulatory Visit: Payer: Self-pay | Admitting: Primary Care

## 2018-01-15 DIAGNOSIS — Z1159 Encounter for screening for other viral diseases: Secondary | ICD-10-CM

## 2018-01-15 DIAGNOSIS — I10 Essential (primary) hypertension: Secondary | ICD-10-CM

## 2018-01-17 ENCOUNTER — Ambulatory Visit: Payer: Medicare Other

## 2018-01-19 ENCOUNTER — Encounter: Payer: Self-pay | Admitting: Primary Care

## 2018-01-19 ENCOUNTER — Ambulatory Visit (INDEPENDENT_AMBULATORY_CARE_PROVIDER_SITE_OTHER): Payer: Medicare Other | Admitting: Primary Care

## 2018-01-19 VITALS — BP 134/84 | HR 61 | Temp 97.6°F | Ht 66.0 in | Wt 160.5 lb

## 2018-01-19 DIAGNOSIS — I10 Essential (primary) hypertension: Secondary | ICD-10-CM | POA: Diagnosis not present

## 2018-01-19 DIAGNOSIS — E2839 Other primary ovarian failure: Secondary | ICD-10-CM

## 2018-01-19 DIAGNOSIS — Z1239 Encounter for other screening for malignant neoplasm of breast: Secondary | ICD-10-CM

## 2018-01-19 DIAGNOSIS — Z23 Encounter for immunization: Secondary | ICD-10-CM

## 2018-01-19 DIAGNOSIS — Z1231 Encounter for screening mammogram for malignant neoplasm of breast: Secondary | ICD-10-CM | POA: Diagnosis not present

## 2018-01-19 DIAGNOSIS — E785 Hyperlipidemia, unspecified: Secondary | ICD-10-CM | POA: Diagnosis not present

## 2018-01-19 DIAGNOSIS — E109 Type 1 diabetes mellitus without complications: Secondary | ICD-10-CM | POA: Diagnosis not present

## 2018-01-19 DIAGNOSIS — Z0000 Encounter for general adult medical examination without abnormal findings: Secondary | ICD-10-CM | POA: Diagnosis not present

## 2018-01-19 MED ORDER — ZOSTER VAC RECOMB ADJUVANTED 50 MCG/0.5ML IM SUSR: 0.5000 mL | Freq: Once | INTRAMUSCULAR | 1 refills | Status: AC

## 2018-01-19 NOTE — Patient Instructions (Addendum)
Try clotrimazole cream to the rash. Apply twice daily. Okay to mix with a small amount of hydrocortisone cream for itching. Try this for one week.  Think about the Pneumonia vaccination, discuss this with Lesia.  Take the shingles vaccination to the pharmacy, make sure to wait at least one month in between Pneumonia vaccination and Shingles vaccination.  Continue exercising. You should be getting 150 minutes of moderate intensity exercise weekly.  Continue to eat a health diet.  Call the [facility] to schedule your Mammogram and Bone Density scan.  Follow up with Katha Cabal tomorrow as scheduled. I'll see you in one year or sooner if needed.  It was a pleasure to see you today!

## 2018-01-19 NOTE — Assessment & Plan Note (Signed)
Recent lipid panel stable, continue atorvastatin. 

## 2018-01-19 NOTE — Assessment & Plan Note (Signed)
Stable in the office today, continue to monitor.  

## 2018-01-19 NOTE — Progress Notes (Signed)
Subjective:    Patient ID: Brooke Morse, female    DOB: 1946-03-20, 72 y.o.   MRN: [id number]  HPI  [name] is a 72 year old female who presents today for complete physical. She has a medicare wellness visit scheduled for tomorrow.   Immunizations: -Tetanus: Believes it's been within 10 years.  -Influenza: Did not receive last season -Pneumonia: Completed Pneumovax 23 in 2016 -Shingles: Never completed   Diet: She endorses a healthy diet. Breakfast: Nuts,protein bar Lunch: Soup, salads, vegetables  Dinner: Vegetables Snacks: Nuts, protein bar Desserts: None Beverages: Water, occasional lemonade   Exercise: She is walking on the treadmill 2 miles daily, 7 days weekly Eye exam: Completed several years ago. She plans on scheduling.  Dental exam: Completes annually  Colonoscopy: Completed in 2016 Dexa: Completed several years ago.  Mammogram: Completed in 2016 Hep C Screen: Pending   Review of Systems  Constitutional: Negative for unexpected weight change.  HENT: Negative for rhinorrhea.   Respiratory: Negative for cough and shortness of breath.   Cardiovascular: Negative for chest pain.  Gastrointestinal: Negative for constipation and diarrhea.  Genitourinary: Negative for difficulty urinating and menstrual problem.  Musculoskeletal: Negative for arthralgias and myalgias.  Skin: Positive for rash.       Two rashes to right neck  Allergic/Immunologic: Negative for environmental allergies.  Neurological: Negative for dizziness, numbness and headaches.  Psychiatric/Behavioral: The patient is not nervous/anxious.        Past Medical History:  Diagnosis Date  . Diabetes mellitus without complication ([place])   . Hyperlipemia      Social History   Socioeconomic History  . Marital status: Divorced    Spouse name: Not on file  . Number of children: Not on file  . Years of education: Not on file  . Highest education level: Not on file  Occupational History  . Not  on file  Social Needs  . Financial resource strain: Not on file  . Food insecurity:    Worry: Not on file    Inability: Not on file  . Transportation needs:    Medical: Not on file    Non-medical: Not on file  Tobacco Use  . Smoking status: Never Smoker  . Smokeless tobacco: Never Used  Substance and Sexual Activity  . Alcohol use: No  . Drug use: No  . Sexual activity: Not on file  Lifestyle  . Physical activity:    Days per week: Not on file    Minutes per session: Not on file  . Stress: Not on file  Relationships  . Social connections:    Talks on phone: Not on file    Gets together: Not on file    Attends religious service: Not on file    Active member of club or organization: Not on file    Attends meetings of clubs or organizations: Not on file    Relationship status: Not on file  . Intimate partner violence:    Fear of current or ex partner: Not on file    Emotionally abused: Not on file    Physically abused: Not on file    Forced sexual activity: Not on file  Other Topics Concern  . Not on file  Social History Narrative   Divorced.   2 children, 5 grandchildren.   Retired. Once worked at a cigarette factory.   Enjoys sewing, cooking, walks 2 miles daily.     Past Surgical History:  Procedure Laterality Date  . bunion  removal    . COLONOSCOPY WITH PROPOFOL N/A 03/18/2015   Procedure: COLONOSCOPY WITH PROPOFOL;  Surgeon: Hulen Luster, MD;  Location: [facility] ENDOSCOPY;  Service: Gastroenterology;  Laterality: N/A;    Family History  Problem Relation Age of Onset  . Colon cancer Father   . Asthma Father     No Known Allergies  Current Outpatient Medications on File Prior to Visit  Medication Sig Dispense Refill  . aspirin 81 MG tablet Take 81 mg by mouth daily.    Marland Kitchen atorvastatin (LIPITOR) 20 MG tablet Take 20 mg by mouth daily.    . Fiber, Guar Gum, CHEW Chew 1 tablet by mouth daily.    . insulin lispro (HUMALOG KWIKPEN) 100 UNIT/ML KiwkPen Inject 12 Units  as directed 3 (three) times daily.    . Multiple Vitamin (MULTIVITAMIN WITH MINERALS) TABS tablet Take 1 tablet by mouth daily.     No current facility-administered medications on file prior to visit.     BP 134/84   Pulse 61   Temp 97.6 F (36.4 C) (Oral)   Ht 5\' 6"  (1.676 m)   Wt 160 lb 8 oz (72.8 kg)   SpO2 97%   BMI 25.91 kg/m    Objective:   Physical Exam  Constitutional: She is oriented to person, place, and time. She appears well-nourished.  HENT:  Right Ear: Tympanic membrane and ear canal normal.  Left Ear: Tympanic membrane and ear canal normal.  Nose: Nose normal.  Mouth/Throat: Oropharynx is clear and moist.  Eyes: Pupils are equal, round, and reactive to light. Conjunctivae and EOM are normal.  Neck: Neck supple. No thyromegaly present.    Cardiovascular: Normal rate and regular rhythm.  No murmur heard. Pulmonary/Chest: Effort normal and breath sounds normal. She has no rales.  Abdominal: Soft. Bowel sounds are normal. There is no tenderness.  Musculoskeletal: Normal range of motion.  Lymphadenopathy:    She has no cervical adenopathy.  Neurological: She is alert and oriented to person, place, and time. She has normal reflexes. No cranial nerve deficit.  Skin: Skin is warm and dry. Rash noted.  Two 2cm circular, skin toned, dry patches to right lower posterior neck  Psychiatric: She has a normal mood and affect.          Assessment & Plan:  Rash:  Located to right posterior lower neck. Appear fungal. Will have her try clotrimazole cream OTC, can mix with hydrocortisone cream for itching. She will update if no improvement.  Pleas Koch, NP

## 2018-01-19 NOTE — Assessment & Plan Note (Signed)
Following with endocrinology 

## 2018-01-19 NOTE — Assessment & Plan Note (Signed)
Prevnar 13 due, she will think about this.  Rx for Shingrix provided. Mammogram and Bone Density due, ordered and pending. Commended her on her healthy lifestyle. Exam with fungal appearing rash to right posterior neck, otherwise unremarkable. Labs pending. Follow up in 1 year or sooner if needed.

## 2018-01-20 ENCOUNTER — Ambulatory Visit (INDEPENDENT_AMBULATORY_CARE_PROVIDER_SITE_OTHER): Payer: Medicare Other

## 2018-01-20 VITALS — BP 118/84 | HR 58 | Temp 97.6°F | Ht 66.5 in | Wt 158.2 lb

## 2018-01-20 DIAGNOSIS — I10 Essential (primary) hypertension: Secondary | ICD-10-CM | POA: Diagnosis not present

## 2018-01-20 DIAGNOSIS — Z1159 Encounter for screening for other viral diseases: Secondary | ICD-10-CM

## 2018-01-20 DIAGNOSIS — Z0000 Encounter for general adult medical examination without abnormal findings: Secondary | ICD-10-CM | POA: Diagnosis not present

## 2018-01-20 LAB — COMPREHENSIVE METABOLIC PANEL
ALBUMIN: 4.1 g/dL (ref 3.5–5.2)
ALT: 13 U/L (ref 0–35)
AST: 18 U/L (ref 0–37)
Alkaline Phosphatase: 50 U/L (ref 39–117)
BUN: 13 mg/dL (ref 6–23)
CHLORIDE: 101 meq/L (ref 96–112)
CO2: 31 mEq/L (ref 19–32)
CREATININE: 0.73 mg/dL (ref 0.40–1.20)
Calcium: 9.5 mg/dL (ref 8.4–10.5)
GFR: 101.37 mL/min (ref >60.00)
Glucose, Bld: 189 mg/dL — ABNORMAL HIGH (ref 70–99)
POTASSIUM: 4.7 meq/L (ref 3.5–5.1)
Sodium: 138 mEq/L (ref 135–145)
Total Bilirubin: 0.7 mg/dL (ref 0.2–1.2)
Total Protein: 6.5 g/dL (ref 6.0–8.3)

## 2018-01-20 NOTE — Progress Notes (Signed)
Subjective:   Brooke Morse is a 72 y.o. female who presents for Medicare Annual (Subsequent) preventive examination.  Review of Systems:  N/A Cardiac Risk Factors include: advanced age (>52men, >24 women);diabetes mellitus     Objective:     Vitals: BP 118/84 (BP Location: Left Arm, Patient Position: Sitting, Cuff Size: Normal) Comment (BP Location): left arm  Pulse (!) 58   Temp 97.6 F (36.4 C) (Oral)   Ht 5' 6.5" (1.689 m) Comment: no shoes  Wt 158 lb 4 oz (71.8 kg)   SpO2 100%   BMI 25.16 kg/m   Body mass index is 25.16 kg/m.  Advanced Directives 01/20/2018  Does Patient Have a Medical Advance Directive? No  Would patient like information on creating a medical advance directive? Yes (MAU/Ambulatory/Procedural Areas - Information given)    Tobacco Social History   Tobacco Use  Smoking Status Never Smoker  Smokeless Tobacco Never Used     Counseling given: No   Clinical Intake:  Pre-visit preparation completed: Yes  Pain : No/denies pain Pain Score: 0-No pain     Nutritional Status: BMI 25 -29 Overweight Nutritional Risks: None Diabetes: Yes CBG done?: No Did pt. bring in CBG monitor from home?: No  How often do you need to have someone help you when you read instructions, pamphlets, or other written materials from your doctor or pharmacy?: 1 - Never What is the last grade level you completed in school?: 12th grade   Interpreter Needed?: No  Comments: pt lives alone Information entered by :: LPinson, LPN  Past Medical History:  Diagnosis Date  . Diabetes mellitus without complication ([place])   . Hyperlipemia    Past Surgical History:  Procedure Laterality Date  . bunion removal    . COLONOSCOPY WITH PROPOFOL N/A 03/18/2015   Procedure: COLONOSCOPY WITH PROPOFOL;  Surgeon: Hulen Luster, MD;  Location: [facility] ENDOSCOPY;  Service: Gastroenterology;  Laterality: N/A;   Family History  Problem Relation Age of Onset  . Colon cancer Father   . Asthma  Father    Social History   Socioeconomic History  . Marital status: Divorced    Spouse name: Not on file  . Number of children: Not on file  . Years of education: Not on file  . Highest education level: Not on file  Occupational History  . Not on file  Social Needs  . Financial resource strain: Not on file  . Food insecurity:    Worry: Not on file    Inability: Not on file  . Transportation needs:    Medical: Not on file    Non-medical: Not on file  Tobacco Use  . Smoking status: Never Smoker  . Smokeless tobacco: Never Used  Substance and Sexual Activity  . Alcohol use: No  . Drug use: No  . Sexual activity: Not on file  Lifestyle  . Physical activity:    Days per week: Not on file    Minutes per session: Not on file  . Stress: Not on file  Relationships  . Social connections:    Talks on phone: Not on file    Gets together: Not on file    Attends religious service: Not on file    Active member of club or organization: Not on file    Attends meetings of clubs or organizations: Not on file    Relationship status: Not on file  Other Topics Concern  . Not on file  Social History Narrative   Divorced.  2 children, 5 grandchildren.   Retired. Once worked at a cigarette factory.   Enjoys sewing, cooking, walks 2 miles daily.     Outpatient Encounter Medications as of 01/20/2018  Medication Sig  . aspirin 81 MG tablet Take 81 mg by mouth daily.  Marland Kitchen atorvastatin (LIPITOR) 20 MG tablet Take 20 mg by mouth daily.  . Fiber, Guar Gum, CHEW Chew 1 tablet by mouth daily.  . insulin lispro (HUMALOG KWIKPEN) 100 UNIT/ML KiwkPen Inject 12 Units as directed 3 (three) times daily.  . Multiple Vitamin (MULTIVITAMIN WITH MINERALS) TABS tablet Take 1 tablet by mouth daily.   No facility-administered encounter medications on file as of 01/20/2018.     Activities of Daily Living In your present state of health, do you have any difficulty performing the following activities:  01/20/2018  Hearing? N  Vision? N  Difficulty concentrating or making decisions? N  Walking or climbing stairs? N  Dressing or bathing? N  Doing errands, shopping? N  Preparing Food and eating ? N  Using the Toilet? N  In the past six months, have you accidently leaked urine? N  Do you have problems with loss of bowel control? N  Managing your Medications? N  Managing your Finances? N  Housekeeping or managing your Housekeeping? N  Some recent data might be hidden    Patient Care Team: Pleas Koch, NP as PCP - General (Internal Medicine)    Assessment:   This is a routine wellness examination for Brooke Morse.   Hearing Screening   125Hz  250Hz  500Hz  1000Hz  2000Hz  3000Hz  4000Hz  6000Hz  8000Hz   Right ear:   40 40 40  40    Left ear:   40 40 40  40      Visual Acuity Screening   Right eye Left eye Both eyes  Without correction: 20/25-1 20/15 20/15  With correction:      Exercise Activities and Dietary recommendations Current Exercise Habits: Home exercise routine, Type of exercise: walking, Time (Minutes): 40, Frequency (Times/Week): 7, Weekly Exercise (Minutes/Week): 280, Intensity: Mild, Exercise limited by: None identified  Goals    . Increase physical activity     Starting 01/20/2018, I will continue to walk at least 2 miles daily.        Fall Risk Fall Risk  01/20/2018  Falls in the past year? No   Depression Screen PHQ 2/9 Scores 01/20/2018  PHQ - 2 Score 0  PHQ- 9 Score 0     Cognitive Function MMSE - Mini Mental State Exam 01/20/2018  Orientation to time 5  Orientation to Place 5  Registration 3  Attention/ Calculation 0  Recall 0  Recall-comments unable to recall 3 of 3 words  Language- name 2 objects 0  Language- repeat 1  Language- follow 3 step command 3  Language- read & follow direction 0  Write a sentence 0  Copy design 0  Total score 17       PLEASE NOTE: A Mini-Cog screen was completed. Maximum score is 20. A value of 0 denotes this part  of [place] MMSE was not completed or the patient failed this part of the Mini-Cog screening.   Mini-Cog Screening Orientation to Time - Max 5 pts Orientation to Place - Max 5 pts Registration - Max 3 pts Recall - Max 3 pts Language Repeat - Max 1 pts Language Follow 3 Step Command - Max 3 pts   Immunization History  Administered Date(s) Administered  . Pneumococcal Polysaccharide-23 01/07/2015  Screening Tests Health Maintenance  Topic Date Due  . MAMMOGRAM  02/02/2019 (Originally 02/01/1998)  . OPHTHALMOLOGY EXAM  02/02/2019 (Originally 02/01/1958)  . DEXA SCAN  02/02/2019 (Originally 02/01/2013)  . PNA vac Low Risk Adult (2 of 2 - PCV13) 01/20/2049 (Originally 01/07/2016)  . FOOT EXAM  04/04/2018  . URINE MICROALBUMIN  04/04/2018  . INFLUENZA VACCINE  05/05/2018  . HEMOGLOBIN A1C  06/05/2018  . TETANUS/TDAP  10/06/2023  . COLONOSCOPY  03/17/2025  . Hepatitis C Screening  Completed      Plan:     I have personally reviewed, addressed, and noted the following in the patient's chart:  A. Medical and social history B. Use of alcohol, tobacco or illicit drugs  C. Current medications and supplements D. Functional ability and status E.  Nutritional status F.  Physical activity G. Advance directives H. List of other physicians I.  Hospitalizations, surgeries, and ER visits in previous 12 months J.  [place] to include hearing, vision, cognitive, depression L. Referrals and appointments - none  In addition, I have reviewed and discussed with patient certain preventive protocols, quality metrics, and best practice recommendations. A written personalized care plan for preventive services as well as general preventive health recommendations were provided to patient.  See attached scanned questionnaire for additional information.   Signed,   Lindell Noe, MHA, BS, LPN Health Coach

## 2018-01-20 NOTE — Patient Instructions (Signed)
Brooke Morse , Thank you for taking time to come for your Medicare Wellness Visit. I appreciate your ongoing commitment to your health goals. Please review the following plan we discussed and let me know if I can assist you in the future.   These are the goals we discussed: Goals    . Increase physical activity     Starting 01/20/2018, I will continue to walk at least 2 miles daily.        This is a list of the screening recommended for you and due dates:  Health Maintenance  Topic Date Due  . Mammogram  02/02/2019*  . Eye exam for diabetics  02/02/2019*  . DEXA scan (bone density measurement)  02/02/2019*  . Pneumonia vaccines (2 of 2 - PCV13) 01/20/2049*  . Complete foot exam   04/04/2018  . Urine Protein Check  04/04/2018  . Flu Shot  05/05/2018  . Hemoglobin A1C  06/05/2018  . Tetanus Vaccine  10/06/2023  . Colon Cancer Screening  03/17/2025  .  Hepatitis C: One time screening is recommended by Center for Disease Control  (CDC) for  adults born from [birth year] through 1963.   Completed  *Topic was postponed. The date shown is not the original due date.   Preventive Care for Adults  A healthy lifestyle and preventive care can promote health and wellness. Preventive health guidelines for adults include the following key practices.  . A routine yearly physical is a good way to check with your health care provider about your health and preventive screening. It is a chance to share any concerns and updates on your health and to receive a thorough exam.  . Visit your dentist for a routine exam and preventive care every 6 months. Brush your teeth twice a day and floss once a day. Good oral hygiene prevents tooth decay and gum disease.  . The frequency of eye exams is based on your age, health, family medical history, use  of contact lenses, and other factors. Follow your health care provider's recommendations for frequency of eye exams.  . Eat a healthy diet. Foods like vegetables, fruits,  whole grains, low-fat dairy products, and lean protein foods contain the nutrients you need without too many calories. Decrease your intake of foods high in solid fats, added sugars, and salt. Eat the right amount of calories for you. Get information about a proper diet from your health care provider, if necessary.  . Regular physical exercise is one of the most important things you can do for your health. Most adults should get at least 150 minutes of moderate-intensity exercise (any activity that increases your heart rate and causes you to sweat) each week. In addition, most adults need muscle-strengthening exercises on 2 or more days a week.  Silver Sneakers may be a benefit available to you. To determine eligibility, you may visit the website: www.silversneakers.com or contact program at 269-505-3903 Mon-Fri between 8AM-8PM.   . Maintain a healthy weight. The body mass index (BMI) is a screening tool to identify possible weight problems. It provides an estimate of body fat based on height and weight. Your health care provider can find your BMI and can help you achieve or maintain a healthy weight.   For adults 20 years and older: ? A BMI below 18.5 is considered underweight. ? A BMI of 18.5 to 24.9 is normal. ? A BMI of 25 to 29.9 is considered overweight. ? A BMI of 30 and above is considered obese.   Marland Kitchen  Maintain normal blood lipids and cholesterol levels by exercising and minimizing your intake of saturated fat. Eat a balanced diet with plenty of fruit and vegetables. Blood tests for lipids and cholesterol should begin at age 105 and be repeated every 5 years. If your lipid or cholesterol levels are high, you are over 50, or you are at high risk for heart disease, you may need your cholesterol levels checked more frequently. Ongoing high lipid and cholesterol levels should be treated with medicines if diet and exercise are not working.  . If you smoke, find out from your health care provider  how to quit. If you do not use tobacco, please do not start.  . If you choose to drink alcohol, please do not consume more than 2 drinks per day. One drink is considered to be 12 ounces (355 mL) of beer, 5 ounces (148 mL) of wine, or 1.5 ounces (44 mL) of liquor.  . If you are 27-70 years old, ask your health care provider if you should take aspirin to prevent strokes.  . Use sunscreen. Apply sunscreen liberally and repeatedly throughout the day. You should seek shade when your shadow is shorter than you. Protect yourself by wearing long sleeves, pants, a wide-brimmed hat, and sunglasses year round, whenever you are outdoors.  . Once a month, do a whole body skin exam, using a mirror to look at the skin on your back. Tell your health care provider of new moles, moles that have irregular borders, moles that are larger than a pencil eraser, or moles that have changed in shape or color.

## 2018-01-20 NOTE — Progress Notes (Signed)
PCP notes:   Health maintenance:  Diabetic health maintenance to be completed by endocrinologist. PCV13 - pt declined Mammogram - order placed by PCP Bone density - order placed by PCP Eye exam - addressed Hep C screening - completed Tetanus vaccine - per pt, vaccine in 2015  Abnormal screenings:   Mini-Cog score: 17/20 MMSE - Mini Mental State Exam 01/20/2018  Orientation to time 5  Orientation to Place 5  Registration 3  Attention/ Calculation 0  Recall 0  Recall-comments unable to recall 3 of 3 words  Language- name 2 objects 0  Language- repeat 1  Language- follow 3 step command 3  Language- read & follow direction 0  Write a sentence 0  Copy design 0  Total score 17    Patient concerns:   None  Nurse concerns:  None  Next PCP appt:   N/A; last seen by PCP on 01/19/18

## 2018-01-21 LAB — HEPATITIS C ANTIBODY
Hepatitis C Ab: NONREACTIVE
SIGNAL TO CUT-OFF: 0.01 (ref <1.00)

## 2018-01-22 NOTE — Progress Notes (Signed)
I reviewed health advisor's note, was available for consultation, and agree with documentation and plan.  

## 2018-02-03 ENCOUNTER — Encounter: Payer: Self-pay | Admitting: Primary Care

## 2018-02-03 DIAGNOSIS — E2839 Other primary ovarian failure: Secondary | ICD-10-CM | POA: Diagnosis not present

## 2018-02-03 DIAGNOSIS — Z1231 Encounter for screening mammogram for malignant neoplasm of breast: Secondary | ICD-10-CM | POA: Diagnosis not present

## 2018-02-03 DIAGNOSIS — M8588 Other specified disorders of bone density and structure, other site: Secondary | ICD-10-CM | POA: Diagnosis not present

## 2018-02-03 DIAGNOSIS — Z780 Asymptomatic menopausal state: Secondary | ICD-10-CM | POA: Diagnosis not present

## 2019-01-26 ENCOUNTER — Ambulatory Visit: Payer: Medicare Other

## 2019-01-27 ENCOUNTER — Other Ambulatory Visit: Payer: Self-pay | Admitting: Primary Care

## 2019-01-27 DIAGNOSIS — I10 Essential (primary) hypertension: Secondary | ICD-10-CM

## 2019-01-27 DIAGNOSIS — E785 Hyperlipidemia, unspecified: Secondary | ICD-10-CM

## 2019-01-30 ENCOUNTER — Other Ambulatory Visit: Payer: Medicare Other

## 2019-01-30 ENCOUNTER — Ambulatory Visit: Payer: Medicare Other

## 2019-01-31 ENCOUNTER — Ambulatory Visit: Payer: Medicare Other | Admitting: Primary Care

## 2019-01-31 ENCOUNTER — Telehealth: Payer: Self-pay

## 2019-01-31 ENCOUNTER — Encounter: Payer: Medicare Other | Admitting: Primary Care

## 2019-01-31 NOTE — Telephone Encounter (Signed)
Client [facility] Night - Client Client Site [place] - Night Physician [facility] - PHYSICIAN, Verita Schneiders- MD Contact Type Call Who Is Calling Patient / Member / Family / Caregiver Caller Name Rochella Benner Caller Phone Number [phone] Patient Name Brooke Morse Patient DOB [birth year] Call Type Message Only Information Provided Reason for Call Request to [facility] Appointment Initial Comment Caller states she has an appointment Thursday 04.30.2020 that she needs to cancel. Additional Comment Caller states she will call back during business hours.

## 2019-01-31 NOTE — Telephone Encounter (Signed)
Appointment has been cancelled.

## 2019-02-01 ENCOUNTER — Other Ambulatory Visit: Payer: Medicare Other

## 2019-02-01 ENCOUNTER — Ambulatory Visit: Payer: Medicare Other

## 2019-02-01 ENCOUNTER — Ambulatory Visit: Payer: Medicare Other | Admitting: Primary Care

## 2019-02-02 ENCOUNTER — Ambulatory Visit: Payer: Medicare Other | Admitting: Primary Care

## 2019-04-25 ENCOUNTER — Ambulatory Visit (INDEPENDENT_AMBULATORY_CARE_PROVIDER_SITE_OTHER): Payer: Medicare Other | Admitting: Family Medicine

## 2019-04-25 ENCOUNTER — Encounter: Payer: Self-pay | Admitting: Family Medicine

## 2019-04-25 VITALS — BP 148/78 | Temp 97.2°F | Wt 150.0 lb

## 2019-04-25 DIAGNOSIS — R0789 Other chest pain: Secondary | ICD-10-CM | POA: Diagnosis not present

## 2019-04-25 DIAGNOSIS — Z9109 Other allergy status, other than to drugs and biological substances: Secondary | ICD-10-CM

## 2019-04-25 NOTE — Assessment & Plan Note (Signed)
Hx of trigger and location of CP reassuring. Advised taking OTC allergy medication x 1-2 weeks to see if symptoms improve. If no improvement could also consider reflux though no association with food. Also discussed deep breathing if anxiety component. Advised in person f/u if not improving and discussed ER precautions

## 2019-04-25 NOTE — Progress Notes (Signed)
I connected with Brooke Morse on 04/25/19 at 10:40 AM EDT by video and verified that I am speaking with the correct person using two identifiers.   I discussed the limitations, risks, security and privacy concerns of performing an evaluation and management service by video and the availability of in person appointments. I also discussed with the patient that there may be a patient responsible charge related to this service. The patient expressed understanding and agreed to proceed.  Patient location: Home Provider Location: [place] Participants: Lesleigh Noe and Brooke Morse   Subjective:     Brooke Morse is a 73 y.o. female presenting for Chest Pain (really started earlier this year. Has allergies. )     Chest Pain  This is a recurrent problem. The current episode started more than 1 month ago. The problem occurs 2 to 4 times per day. The problem has been gradually worsening. Pain location: near collar bone area. The pain is mild. The quality of the pain is described as tightness. The pain does not radiate. Associated symptoms include palpitations (sometimes) and shortness of breath. Pertinent negatives include no abdominal pain, cough, exertional chest pressure, irregular heartbeat, numbness or weakness. The pain is aggravated by breathing and deep breathing (allergens, heat). Treatments tried: avoiding allergens, but nothing with heat.     Hx of seasonal allergies but no hx of asthma  No connection to food, eats the same thing   Review of Systems  Respiratory: Positive for chest tightness and shortness of breath. Negative for cough and wheezing.   Cardiovascular: Positive for palpitations (sometimes). Negative for chest pain.  Gastrointestinal: Negative for abdominal pain.  Neurological: Negative for weakness and numbness.     Social History   Tobacco Use  Smoking Status Never Smoker  Smokeless Tobacco Never Used        Objective:   BP Readings  from Last 3 Encounters:  04/25/19 (!) 148/78  01/20/18 118/84  01/19/18 134/84   Wt Readings from Last 3 Encounters:  04/25/19 150 lb (68 kg)  01/20/18 158 lb 4 oz (71.8 kg)  01/19/18 160 lb 8 oz (72.8 kg)   BP (!) 148/78 Comment: per patient on7/20/2020  Temp (!) 97.2 F (36.2 C) Comment: per patient  Wt 150 lb (68 kg) Comment: per patient  BMI 23.85 kg/m    Physical Exam Constitutional:      Appearance: Normal appearance. She is not ill-appearing.  HENT:     Head: Normocephalic and atraumatic.     Right Ear: External ear normal.     Left Ear: External ear normal.  Eyes:     Conjunctiva/sclera: Conjunctivae normal.  Cardiovascular:     Comments: Points to lower neck for location of chest pain Pulmonary:     Effort: Pulmonary effort is normal. No respiratory distress.  Neurological:     Mental Status: She is alert. Mental status is at baseline.  Psychiatric:        Mood and Affect: Mood normal.        Behavior: Behavior normal.        Thought Content: Thought content normal.        Judgment: Judgment normal.             Assessment & Plan:   Problem List Items Addressed This Visit      Other   Environmental allergies - Primary    Hx of trigger and location of CP reassuring. Advised taking OTC allergy  medication x 1-2 weeks to see if symptoms improve. If no improvement could also consider reflux though no association with food. Also discussed deep breathing if anxiety component. Advised in person f/u if not improving and discussed ER precautions       Other Visit Diagnoses    Atypical chest pain           Return if symptoms worsen or fail to improve.  Lesleigh Noe, MD

## 2021-03-20 DIAGNOSIS — E10649 Type 1 diabetes mellitus with hypoglycemia without coma: Secondary | ICD-10-CM | POA: Diagnosis not present

## 2021-07-10 DIAGNOSIS — E10649 Type 1 diabetes mellitus with hypoglycemia without coma: Secondary | ICD-10-CM | POA: Diagnosis not present

## 2021-07-10 DIAGNOSIS — E1069 Type 1 diabetes mellitus with other specified complication: Secondary | ICD-10-CM | POA: Diagnosis not present

## 2021-07-10 DIAGNOSIS — E785 Hyperlipidemia, unspecified: Secondary | ICD-10-CM | POA: Diagnosis not present

## 2021-07-10 LAB — HM DIABETES FOOT EXAM: HM Diabetic Foot Exam: NEGATIVE

## 2021-07-10 LAB — HEMOGLOBIN A1C: Hemoglobin A1C: 8.2

## 2022-02-04 ENCOUNTER — Encounter: Payer: Medicare Other | Admitting: Primary Care

## 2022-03-05 ENCOUNTER — Ambulatory Visit (INDEPENDENT_AMBULATORY_CARE_PROVIDER_SITE_OTHER): Payer: Medicare Other | Admitting: Primary Care

## 2022-03-05 ENCOUNTER — Encounter: Payer: Self-pay | Admitting: Primary Care

## 2022-03-05 VITALS — BP 140/76 | HR 76 | Temp 98.6°F | Ht 66.5 in | Wt 145.0 lb

## 2022-03-05 DIAGNOSIS — E785 Hyperlipidemia, unspecified: Secondary | ICD-10-CM

## 2022-03-05 DIAGNOSIS — I10 Essential (primary) hypertension: Secondary | ICD-10-CM

## 2022-03-05 DIAGNOSIS — E1065 Type 1 diabetes mellitus with hyperglycemia: Secondary | ICD-10-CM | POA: Diagnosis not present

## 2022-03-05 DIAGNOSIS — R2689 Other abnormalities of gait and mobility: Secondary | ICD-10-CM

## 2022-03-05 DIAGNOSIS — R2681 Unsteadiness on feet: Secondary | ICD-10-CM

## 2022-03-05 DIAGNOSIS — R42 Dizziness and giddiness: Secondary | ICD-10-CM

## 2022-03-05 LAB — CBC
HCT: 42.6 % (ref 36.0–46.0)
Hemoglobin: 14 g/dL (ref 12.0–15.0)
MCHC: 32.8 g/dL (ref 30.0–36.0)
MCV: 82.9 fl (ref 78.0–100.0)
Platelets: 169 10*3/uL (ref 150.0–400.0)
RBC: 5.14 Mil/uL — ABNORMAL HIGH (ref 3.87–5.11)
RDW: 14.2 % (ref 11.5–15.5)
WBC: 4.1 10*3/uL (ref 4.0–10.5)

## 2022-03-05 LAB — LIPID PANEL
Cholesterol: 228 mg/dL — ABNORMAL HIGH (ref 0–200)
HDL: 71.4 mg/dL (ref >39.00)
LDL Cholesterol: 145 mg/dL — ABNORMAL HIGH (ref 0–99)
NonHDL: 156.41
Total CHOL/HDL Ratio: 3
Triglycerides: 57 mg/dL (ref 0.0–149.0)
VLDL: 11.4 mg/dL (ref 0.0–40.0)

## 2022-03-05 LAB — COMPREHENSIVE METABOLIC PANEL
ALT: 11 U/L (ref 0–35)
AST: 17 U/L (ref 0–37)
Albumin: 4.1 g/dL (ref 3.5–5.2)
Alkaline Phosphatase: 57 U/L (ref 39–117)
BUN: 12 mg/dL (ref 6–23)
CO2: 32 mEq/L (ref 19–32)
Calcium: 9.7 mg/dL (ref 8.4–10.5)
Chloride: 101 mEq/L (ref 96–112)
Creatinine, Ser: 0.81 mg/dL (ref 0.40–1.20)
GFR: 71.68 mL/min (ref >60.00)
Glucose, Bld: 159 mg/dL — ABNORMAL HIGH (ref 70–99)
Potassium: 5 mEq/L (ref 3.5–5.1)
Sodium: 140 mEq/L (ref 135–145)
Total Bilirubin: 0.5 mg/dL (ref 0.2–1.2)
Total Protein: 6.5 g/dL (ref 6.0–8.3)

## 2022-03-05 LAB — TSH: TSH: 0.74 u[IU]/mL (ref 0.35–5.50)

## 2022-03-05 NOTE — Assessment & Plan Note (Signed)
Following with endocrinology, office notes and labs reviewed from October 2022 through care everywhere.  Continue Humalog 12 units 3 times daily with meals. Strongly advise she resume regular activity on her treadmill.

## 2022-03-05 NOTE — Assessment & Plan Note (Signed)
Acute and 1 month duration of symptoms which have since resolved. Symptoms are more suggestive of vertigo, but need to rule out metabolic cause.  Neuro exam today negative.  I instructed her to notify me if symptoms resume, at that point we will likely proceed with imaging.

## 2022-03-05 NOTE — Progress Notes (Signed)
Subjective:    Patient ID: Brooke Morse, female    DOB: [birth year], 76 y.o.   MRN: [id number]  HPI  LYFE REIHL is a very pleasant 76 y.o. female with a history of type 1 diabetes, hyperlipidemia, hypertension who presents today to reestablish care. Her daughter joins [place] today.  She has not been seen in our clinic since April 2019.  1) Type 1 Diabetes: Currently managed on Humalog 12 units three times daily. Following with endocrinology, last visit was in October 2022, A1C of 8.2. Humalog was increased from 5 units TID to 12 TID.   Next appointment due in July 2023  2) Hyperlipidemia: Currently managed on atorvastatin 20 mg daily. Last lipid panel was drawn in February 2022 per endocrinology.   3) Poor Posture and Balance: Chronic and decline to general muscle mass, stamina, balance. Her daughter is concerned about her overall decline.   She is mostly sedentary during her day. She was previously walking 2 miles daily on her treadmill, most everyday. Her treadmill broke 9 months ago so she's mostly been sedentary since. She does have a new treadmill and plans on resuming walking daily.  She feels unsteady and off balance with general ambulation, has to hold onto objects during the day. She's able to dress and bathe herself but she does struggle with putting on pants and underwear.   She has a cane for which she does not use in her home, mostly when going out of her home.  She does not drive often due to fear of imbalance.  She had an episode of dizziness about one month ago that occurred shortly after she was lying on the ground doing some floor exercises. Shortly after these exercises she developed room spinning sensation dizziness, nausea, vomiting.  Her dizziness lasted for about a month with gradual improvement every day.  He denies unilateral weakness, changes in speech, headaches, confusion.  Her daughter agrees.  Her dizziness now has resolved.  No prior diagnosis of vertigo or  stroke.   Wt Readings from Last 3 Encounters:  03/05/22 145 lb (65.8 kg)  04/25/19 150 lb (68 kg)  01/20/18 158 lb 4 oz (71.8 kg)     BP Readings from Last 3 Encounters:  03/05/22 140/76  04/25/19 (!) 148/78  01/20/18 118/84        Review of Systems  Constitutional:  Negative for fatigue.  Respiratory:  Negative for shortness of breath.   Cardiovascular:  Negative for chest pain.  Gastrointestinal:  Positive for constipation.  Neurological:  Positive for dizziness. Negative for weakness, numbness and headaches.  Psychiatric/Behavioral:  Positive for sleep disturbance. Negative for confusion. The patient is not nervous/anxious.         Past Medical History:  Diagnosis Date   Diabetes mellitus without complication ([place])    Hyperlipemia     Social History   Socioeconomic History   Marital status: Divorced    Spouse name: Not on file   Number of children: Not on file   Years of education: Not on file   Highest education level: Not on file  Occupational History   Not on file  Tobacco Use   Smoking status: Never   Smokeless tobacco: Never  Vaping Use   Vaping Use: Never used  Substance and Sexual Activity   Alcohol use: No   Drug use: No   Sexual activity: Not on file  Other Topics Concern   Not on file  Social History Narrative   Divorced.  2 children, 5 grandchildren.   Retired. Once worked at a cigarette factory.   Enjoys sewing, cooking, walks 2 miles daily.    Social Determinants of Health   Financial Resource Strain: Not on file  Food Insecurity: Not on file  Transportation Needs: Not on file  Physical Activity: Not on file  Stress: Not on file  Social Connections: Not on file  Intimate Partner Violence: Not on file    Past Surgical History:  Procedure Laterality Date   bunion removal     COLONOSCOPY WITH PROPOFOL N/A 03/18/2015   Procedure: COLONOSCOPY WITH PROPOFOL;  Surgeon: Hulen Luster, MD;  Location: [facility]COPY;  Service:  Gastroenterology;  Laterality: N/A;    Family History  Problem Relation Age of Onset   Colon cancer Father    Asthma Father     No Known Allergies  Current Outpatient Medications on File Prior to Visit  Medication Sig Dispense Refill   ascorbic acid (VITAMIN C) 1000 MG tablet Take by mouth.     aspirin 81 MG tablet Take 81 mg by mouth. About 2 times a week     atorvastatin (LIPITOR) 20 MG tablet Take 20 mg by mouth. 2 a week     Fiber, Guar Gum, CHEW Chew 1 tablet by mouth daily.     insulin lispro (HUMALOG) 100 UNIT/ML KiwkPen Inject 12 Units as directed 3 (three) times daily.     Insulin Pen Needle (FIFTY50 PEN NEEDLES) 32G X 4 MM MISC USE FOUR TIMES A DAY AS DIRECTED.     Lancets (ONETOUCH DELICA PLUS BCWUGQ91Q) MISC USE TO TEST BLOOD SUGAR FOUR TIMES DAILY AS INSTRUCTED     Multiple Vitamin (MULTIVITAMIN WITH MINERALS) TABS tablet Take 1 tablet by mouth daily.     Omega-3 Fatty Acids (OMEGA-3 1450 PO) Take by mouth.     ONETOUCH ULTRA test strip USE 1 STRIP TO CHECK GLUCOSE 4 TIMES DAILY     No current facility-administered medications on file prior to visit.    BP 140/76   Pulse 76   Temp 98.6 F (37 C) (Oral)   Ht 5' 6.5" (1.689 m)   Wt 145 lb (65.8 kg)   SpO2 97%   BMI 23.05 kg/m  Objective:   Physical Exam Eyes:     Extraocular Movements: Extraocular movements intact.  Cardiovascular:     Rate and Rhythm: Normal rate and regular rhythm.  Pulmonary:     Effort: Pulmonary effort is normal.     Breath sounds: Normal breath sounds.  Musculoskeletal:        General: Normal range of motion.     Cervical back: Neck supple.  Skin:    General: Skin is warm and dry.  Neurological:     Mental Status: She is alert and oriented to person, place, and time.     Cranial Nerves: No cranial nerve deficit.     Comments: Slightly unsteady gait.    Psychiatric:        Mood and Affect: Mood normal.          Assessment & Plan:

## 2022-03-05 NOTE — Assessment & Plan Note (Signed)
Continue atorvastatin 20 mg daily. Lipid panel pending.

## 2022-03-05 NOTE — Assessment & Plan Note (Signed)
Gait and balance with overall decline in strength.  Long discussion today with both patient and daughter, we agree that her sedentary lifestyle has likely contributed.  We will check labs today to rule out metabolic causes.  Referral placed for home health physical therapy to help improve strength/balance. She will resume regular exercise on her treadmill.

## 2022-03-05 NOTE — Assessment & Plan Note (Signed)
Overall stable, prior readings in the chart are much lower.  Continue to monitor.

## 2022-03-05 NOTE — Patient Instructions (Signed)
Stop by the lab prior to leaving today. I will notify you of your results once received.   You will be contacted regarding your referral to home health physical threapy.  Please let us know if you have not been contacted within two weeks.   It was a pleasure to see you today!

## 2022-03-12 ENCOUNTER — Telehealth: Payer: Self-pay

## 2022-03-12 NOTE — Telephone Encounter (Signed)
Error

## 2022-03-16 ENCOUNTER — Telehealth: Payer: Self-pay | Admitting: Primary Care

## 2022-03-16 NOTE — Telephone Encounter (Signed)
Daughter called about PT for her mom, said they have not received a callback from anyone and she was last seen by Anda Kraft on 6.1.2023 for a physical. The daughter would like to speak to this nurse about this further, please advise and return a callback when possible, thanks.  Callback Number: [phone]

## 2022-03-17 NOTE — Telephone Encounter (Signed)
Daughter came into the office to address a concern about not hearing back about her mom's PT verbal orders, she stated she was last seen by Anda Kraft on June 1st but was just concerned and worried on when she would hear back. Please advise, and return a call back, thanks.  Callback Number: [phone]

## 2022-03-18 ENCOUNTER — Other Ambulatory Visit: Payer: Self-pay | Admitting: Primary Care

## 2022-03-18 DIAGNOSIS — E785 Hyperlipidemia, unspecified: Secondary | ICD-10-CM

## 2022-03-19 NOTE — Telephone Encounter (Signed)
Per [facility]:    RE: [facility] Referral  03/19/2022 Ruscelloni, Elray Buba M, CMA ALL PT spots have been filled for this week since Tuesday. Once the log drops this afternoon, our scheduler will call to set them up. Hopefully it will be this weekend. I know we have a lot of PT to be scheduled on this new log.   Regardless, she will be contacted soon :)

## 2022-03-19 NOTE — Telephone Encounter (Signed)
[facility] has accepted her but they have several patients to call. I sent them about 5 patients. They said they would call to set up start of care date. Pt was accepted as of 03/17/2022. Not sure if the turn around time of them calling the patient and setting up [facility] date.   I messaged Christian Ruscilini with [facility] to get an update

## 2022-03-20 NOTE — Telephone Encounter (Signed)
Called daughter verified that she has been contacted. She has first appointment tomorrow. She will call if any further questions.

## 2022-03-21 DIAGNOSIS — E785 Hyperlipidemia, unspecified: Secondary | ICD-10-CM | POA: Diagnosis not present

## 2022-03-21 DIAGNOSIS — E109 Type 1 diabetes mellitus without complications: Secondary | ICD-10-CM | POA: Diagnosis not present

## 2022-03-21 DIAGNOSIS — I10 Essential (primary) hypertension: Secondary | ICD-10-CM | POA: Diagnosis not present

## 2022-03-31 DIAGNOSIS — E785 Hyperlipidemia, unspecified: Secondary | ICD-10-CM | POA: Diagnosis not present

## 2022-03-31 DIAGNOSIS — E109 Type 1 diabetes mellitus without complications: Secondary | ICD-10-CM | POA: Diagnosis not present

## 2022-03-31 DIAGNOSIS — I10 Essential (primary) hypertension: Secondary | ICD-10-CM | POA: Diagnosis not present

## 2022-04-08 ENCOUNTER — Ambulatory Visit (INDEPENDENT_AMBULATORY_CARE_PROVIDER_SITE_OTHER): Payer: Medicare Other

## 2022-04-08 VITALS — Ht 66.5 in | Wt 145.0 lb

## 2022-04-08 DIAGNOSIS — Z0000 Encounter for general adult medical examination without abnormal findings: Secondary | ICD-10-CM | POA: Diagnosis not present

## 2022-04-08 NOTE — Progress Notes (Signed)
Subjective:   Brooke Morse is a 76 y.o. female who presents for Medicare Annual (Subsequent) preventive examination.  Review of Systems    Virtual Visit via Telephone Note  I connected with  Brooke Morse on 04/08/22 at  2:00 PM EDT by telephone and verified that I am speaking with the correct person using two identifiers.  Location: Patient: Home Provider: Office Persons participating in the virtual visit: patient/Nurse Health Advisor   I discussed the limitations, risks, security and privacy concerns of performing an evaluation and management service by telephone and the availability of in person appointments. The patient expressed understanding and agreed to proceed.  Interactive audio and video telecommunications were attempted between this nurse and patient, however failed, due to patient having technical difficulties OR patient did not have access to video capability.  We continued and completed visit with audio only.  Some vital signs may be absent or patient reported.   Brooke Peaches, LPN  Cardiac Risk Factors include: advanced age (>81mn, >>26women);diabetes mellitus;hypertension     Objective:    Today's Vitals   04/08/22 1357  Weight: 145 lb (65.8 kg)  Height: 5' 6.5" (1.689 m)   Body mass index is 23.05 kg/m.     04/08/2022    2:08 PM 01/20/2018   11:13 AM  Advanced Directives  Does Patient Have a Medical Advance Directive? No No  Would patient like information on creating a medical advance directive? No - Patient declined Yes (MAU/Ambulatory/Procedural Areas - Information given)    Current Medications (verified) Outpatient Encounter Medications as of 04/08/2022  Medication Sig   ascorbic acid (VITAMIN C) 1000 MG tablet Take by mouth.   aspirin 81 MG tablet Take 81 mg by mouth. About 2 times a week   atorvastatin (LIPITOR) 20 MG tablet Take 20 mg by mouth. 2 a week   Fiber, Guar Gum, CHEW Chew 1 tablet by mouth daily.   insulin lispro (HUMALOG) 100  UNIT/ML KiwkPen Inject 12 Units as directed 3 (three) times daily.   Insulin Pen Needle (FIFTY50 PEN NEEDLES) 32G X 4 MM MISC USE FOUR TIMES A DAY AS DIRECTED.   Lancets (ONETOUCH DELICA PLUS [id number] MISC USE TO TEST BLOOD SUGAR FOUR TIMES DAILY AS INSTRUCTED   Multiple Vitamin (MULTIVITAMIN WITH MINERALS) TABS tablet Take 1 tablet by mouth daily.   Omega-3 Fatty Acids (OMEGA-3 1450 PO) Take by mouth.   ONETOUCH ULTRA test strip USE 1 STRIP TO CHECK GLUCOSE 4 TIMES DAILY   No facility-administered encounter medications on file as of 04/08/2022.    Allergies (verified) Patient has no known allergies.   History: Past Medical History:  Diagnosis Date   Diabetes mellitus without complication ([place]    Hyperlipemia    Past Surgical History:  Procedure Laterality Date   bunion removal     COLONOSCOPY WITH PROPOFOL N/A 03/18/2015   Procedure: COLONOSCOPY WITH PROPOFOL;  Surgeon: PHulen Luster MD;  [facility]  Service: Gastroenterology;  Laterality: N/A;   Family History  Problem Relation Age of Onset   Colon cancer Father    Asthma Father    Social History   Socioeconomic History   Marital status: Divorced    Spouse name: Not on file   Number of children: Not on file   Years of education: Not on file   Highest education level: Not on file  Occupational History   Not on file  Tobacco Use   Smoking status: Never   Smokeless tobacco:  Never  Vaping Use   Vaping Use: Never used  Substance and Sexual Activity   Alcohol use: No   Drug use: No   Sexual activity: Not on file  Other Topics Concern   Not on file  Social History Narrative   Divorced.   2 children, 5 grandchildren.   Retired. Once worked at a cigarette factory.   Enjoys sewing, cooking, walks 2 miles daily.    Social Determinants of Health   Financial Resource Strain: Low Risk  (04/08/2022)   Overall Financial Resource Strain (CARDIA)    Difficulty of Paying Living Expenses: Not hard at all  Food  Insecurity: No Food Insecurity (04/08/2022)   Hunger Vital Sign    Worried About Running Out of Food in the Last Year: Never true    Ran Out of Food in the Last Year: Never true  Transportation Needs: No Transportation Needs (04/08/2022)   PRAPARE - Hydrologist (Medical): No    Lack of Transportation (Non-Medical): No  Physical Activity: Sufficiently Active (04/08/2022)   Exercise Vital Sign    Days of Exercise per Week: 5 days    Minutes of Exercise per Session: 30 min  Stress: No Stress Concern Present (04/08/2022)   [place]    Feeling of Stress : Not at all  Social Connections: Socially Isolated (04/08/2022)   Social Connection and Isolation Panel [NHANES]    Frequency of Communication with Friends and Family: More than three times a week    Frequency of Social Gatherings with Friends and Family: More than three times a week    Attends Religious Services: Never    Marine scientist or Organizations: No    Attends Archivist Meetings: Never    Marital Status: Divorced     Clinical Intake: Nutrition Risk Assessment:  Has the patient had any N/V/D within the last 2 months?  No  Does the patient have any non-healing wounds?  No  Has the patient had any unintentional weight loss or weight gain?  No   Diabetes:  Is the patient diabetic?  Yes  If diabetic, was a CBG obtained today?  Yes CBG 159 Taken by patient Did the patient bring in their glucometer from home?  No  How often do you monitor your CBG's? 3x Daily.   Financial Strains and Diabetes Management:  Are you having any financial strains with the device, your supplies or your medication? No .  Does the patient want to be seen by Chronic Care Management for management of their diabetes?  No  Would the patient like to be referred to a Nutritionist or for Diabetic Management?  No   Diabetic Exams:  Diabetic Eye Exam:  Completed Yes. Overdue for diabetic eye exam. Pt has been advised about the importance in completing this exam. A referral has been placed today. Message sent to referral coordinator for scheduling purposes. Advised pt to expect a call from office referred to regarding appt.  Diabetic Foot Exam: Completed Yes. Pt has been advised about the importance in completing this exam. Pt is scheduled for diabetic foot exam on Followed by Dr Judithann Sheen   Pre-visit preparation completed: No  Diabetic?  Yes  Activities of Daily Living    04/08/2022    2:04 PM  In your present state of health, do you have any difficulty performing the following activities:  Hearing? 0  Vision? 0  Difficulty concentrating or  making decisions? 0  Walking or climbing stairs? 0  Dressing or bathing? 0  Doing errands, shopping? 0  Preparing Food and eating ? N  Using the Toilet? N  In the past six months, have you accidently leaked urine? N  Do you have problems with loss of bowel control? N  Managing your Medications? N  Managing your Finances? N  Housekeeping or managing your Housekeeping? N    Patient Care Team: Pleas Koch, NP as PCP - General (Internal Medicine)  Indicate any recent Medical Services you may have received from other than [facility] providers in the past year (date may be approximate).     Assessment:   This is a routine wellness examination for Brooke Morse.  Hearing/Vision screen Hearing Screening - Comments:: No hearing difficulty Vision Screening - Comments:: Wears reading glasses. Followed by [facility]  Dietary issues and exercise activities discussed: Exercise limited by: None identified   Goals Addressed               This Visit's Progress     Increase physical activity (pt-stated)         Depression Screen    04/08/2022    2:02 PM 03/05/2022   11:39 AM 01/20/2018   11:12 AM  PHQ 2/9 Scores  PHQ - 2 Score 0 0 0  PHQ- 9 Score 0 3 0    Fall Risk    04/08/2022     2:05 PM 03/05/2022   11:40 AM 01/20/2018   11:12 AM  Fall Risk   Falls in the past year? 0 1 No  Number falls in past yr: 0 0   Injury with Fall? 0 0   Risk for fall due to : No Fall Risks Impaired balance/gait   Follow up  Falls evaluation completed     FALL RISK PREVENTION PERTAINING TO THE HOME:  Any stairs in or around the home? Yes  If so, are there any without handrails? No  Home free of loose throw rugs in walkways, pet beds, electrical cords, etc? Yes  Adequate lighting in your home to reduce risk of falls? Yes   ASSISTIVE DEVICES UTILIZED TO PREVENT FALLS:  Life alert? No  Use of a cane, walker or w/c? Yes  Grab bars in the bathroom? Yes  Shower chair or bench in shower? Yes  Elevated toilet seat or a handicapped toilet? Yes   TIMED UP AND GO:  Was the test performed? No . Audio Visit  Cognitive Function:    01/20/2018   11:25 AM  MMSE - Mini Mental State Exam  Orientation to time 5  Orientation to Place 5  Registration 3  Attention/ Calculation 0  Recall 0  Recall-comments unable to recall 3 of 3 words  Language- name 2 objects 0  Language- repeat 1  Language- follow 3 step command 3  Language- read & follow direction 0  Write a sentence 0  Copy design 0  Total score 17        04/08/2022    2:06 PM  6CIT Screen  What Year? 0 points  What month? 0 points  What time? 0 points  Count back from 20 0 points  Months in reverse 0 points  Repeat phrase 0 points  Total Score 0 points    Immunizations Immunization History  Administered Date(s) Administered   Pneumococcal Polysaccharide-23 01/07/2015    TDAP status: Up to date  Flu Vaccine status: Declined, Education has been provided regarding the importance of  this vaccine but patient still declined. Advised may receive this vaccine at local pharmacy or Health Dept. Aware to provide a copy of the vaccination record if obtained from local pharmacy or Health Dept. Verbalized acceptance and  understanding.  Pneumococcal vaccine status: Declined,  Education has been provided regarding the importance of this vaccine but patient still declined. Advised may receive this vaccine at local pharmacy or Health Dept. Aware to provide a copy of the vaccination record if obtained from local pharmacy or Health Dept. Verbalized acceptance and understanding.   Covid-19 vaccine status: Completed vaccines  Qualifies for Shingles Vaccine? Yes   Zostavax completed No   Shingrix Completed?: No.    Education has been provided regarding the importance of this vaccine. Patient has been advised to call insurance company to determine out of pocket expense if they have not yet received this vaccine. Advised may also receive vaccine at local pharmacy or Health Dept. Verbalized acceptance and understanding.  Screening Tests Health Maintenance  Topic Date Due   OPHTHALMOLOGY EXAM  Never done   HEMOGLOBIN A1C  01/08/2022   URINE MICROALBUMIN  05/09/2022 (Originally 04/04/2018)   Zoster Vaccines- Shingrix (1 of 2) 07/09/2022 (Originally 02/01/1998)   Pneumonia Vaccine 72+ Years old (2 - PCV) 04/09/2023 (Originally 01/07/2016)   MAMMOGRAM  04/09/2023 (Originally 02/04/2020)   INFLUENZA VACCINE  05/05/2022   FOOT EXAM  07/10/2022   TETANUS/TDAP  10/06/2023   COLONOSCOPY (Pts 45-21yr Insurance coverage will need to be confirmed)  03/17/2025   DEXA SCAN  Completed   Hepatitis C Screening  Completed   HPV VACCINES  Aged Out   COVID-19 Vaccine  Discontinued    Health Maintenance  Health Maintenance Due  Topic Date Due   OPHTHALMOLOGY EXAM  Never done   HEMOGLOBIN A1C  01/08/2022    Colorectal cancer screening: Type of screening: Colonoscopy. Completed 03/18/15. Repeat every 10 years  Mammogram status: Ordered Patient deferred. Pt provided with contact info and advised to call to schedule appt.   Bone Density status: Completed 02/03/18. Results reflect: Bone density results: OSTEOPENIA. Repeat every    years.  Lung Cancer Screening: (Low Dose CT Chest recommended if Age 74-80years, 30 pack-year currently smoking OR have quit w/in 15years.) does not qualify.     Additional Screening:  Hepatitis C Screening: does qualify; Completed Patient deferred  Vision Screening: Recommended annual ophthalmology exams for early detection of glaucoma and other disorders of the eye. Is the patient up to date with their annual eye exam?  Yes  Who is the provider or what is the name of the office in which the patient attends annual eye exams? [facility] If pt is not established with a provider, would they like to be referred to a provider to establish care? No .   Dental Screening: Recommended annual dental exams for proper oral hygiene  Community Resource Referral / Chronic Care Management:  CRR required this visit?  No   CCM required this visit?  No      Plan:     I have personally reviewed and noted the following in the patient's chart:   Medical and social history Use of alcohol, tobacco or illicit drugs  Current medications and supplements including opioid prescriptions.  Functional ability and status Nutritional status Physical activity Advanced directives List of other physicians Hospitalizations, surgeries, and ER visits in previous 12 months Vitals Screenings to include cognitive, depression, and falls Referrals and appointments  In addition, I have reviewed and discussed with  patient certain preventive protocols, quality metrics, and best practice recommendations. A written personalized care plan for preventive services as well as general preventive health recommendations were provided to patient.     Brooke Peaches, LPN   06/11/9498   Nurse Notes: None

## 2022-04-08 NOTE — Patient Instructions (Addendum)
Brooke Morse , Thank you for taking time to come for your Medicare Wellness Visit. I appreciate your ongoing commitment to your health goals. Please review the following plan we discussed and let me know if I can assist you in the future.   These are the goals we discussed:  Goals       Increase physical activity      Starting 01/20/2018, I will continue to walk at least 2 miles daily.       Increase physical activity (pt-stated)        This is a list of the screening recommended for you and due dates:  Health Maintenance  Topic Date Due   Eye exam for diabetics  Never done   Hemoglobin A1C  01/08/2022   Urine Protein Check  05/09/2022*   Zoster (Shingles) Vaccine (1 of 2) 07/09/2022*   Pneumonia Vaccine (2 - PCV) 04/09/2023*   Mammogram  04/09/2023*   Flu Shot  05/05/2022   Complete foot exam   07/10/2022   Tetanus Vaccine  10/06/2023   Colon Cancer Screening  03/17/2025   DEXA scan (bone density measurement)  Completed   Hepatitis C Screening: USPSTF Recommendation to screen - Ages 37-79 yo.  Completed   HPV Vaccine  Aged Out   COVID-19 Vaccine  Discontinued  *Topic was postponed. The date shown is not the original due date.    Advanced directives: No  Conditions/risks identified: None  Next appointment: Follow up in one year for your annual wellness visit     Preventive Care 65 Years and Older, Female Preventive care refers to lifestyle choices and visits with your health care provider that can promote health and wellness. What does preventive care include? A yearly physical exam. This is also called an annual well check. Dental exams once or twice a year. Routine eye exams. Ask your health care provider how often you should have your eyes checked. Personal lifestyle choices, including: Daily care of your teeth and gums. Regular physical activity. Eating a healthy diet. Avoiding tobacco and drug use. Limiting alcohol use. Practicing safe sex. Taking low-dose  aspirin every day. Taking vitamin and mineral supplements as recommended by your health care provider. What happens during an annual well check? The services and screenings done by your health care provider during your annual well check will depend on your age, overall health, lifestyle risk factors, and family history of disease. Counseling  Your health care provider may ask you questions about your: Alcohol use. Tobacco use. Drug use. Emotional well-being. Home and relationship well-being. Sexual activity. Eating habits. History of falls. Memory and ability to understand (cognition). Work and work Statistician. Reproductive health. Screening  You may have the following tests or measurements: Height, weight, and BMI. Blood pressure. Lipid and cholesterol levels. These may be checked every 5 years, or more frequently if you are over 40 years old. Skin check. Lung cancer screening. You may have this screening every year starting at age 18 if you have a 30-pack-year history of smoking and currently smoke or have quit within the past 15 years. Fecal occult blood test (FOBT) of the stool. You may have this test every year starting at age 77. Flexible sigmoidoscopy or colonoscopy. You may have a sigmoidoscopy every 5 years or a colonoscopy every 10 years starting at age 32. Hepatitis C blood test. Hepatitis B blood test. Sexually transmitted disease (STD) testing. Diabetes screening. This is done by checking your blood sugar (glucose) after you have not eaten for  a while (fasting). You may have this done every 1-3 years. Bone density scan. This is done to screen for osteoporosis. You may have this done starting at age 20. Mammogram. This may be done every 1-2 years. Talk to your health care provider about how often you should have regular mammograms. Talk with your health care provider about your test results, treatment options, and if necessary, the need for more tests. Vaccines  Your  health care provider may recommend certain vaccines, such as: Influenza vaccine. This is recommended every year. Tetanus, diphtheria, and acellular pertussis (Tdap, Td) vaccine. You may need a Td booster every 10 years. Zoster vaccine. You may need this after age 60. Pneumococcal 13-valent conjugate (PCV13) vaccine. One dose is recommended after age 23. Pneumococcal polysaccharide (PPSV23) vaccine. One dose is recommended after age 63. Talk to your health care provider about which screenings and vaccines you need and how often you need them. This information is not intended to replace advice given to you by your health care provider. Make sure you discuss any questions you have with your health care provider. Document Released: 10/18/2015 Document Revised: 06/10/2016 Document Reviewed: 07/23/2015 Elsevier Interactive Patient Education  2017 [place] Prevention in the Home Falls can cause injuries. They can happen to people of all ages. There are many things you can do to make your home safe and to help prevent falls. What can I do on the outside of my home? Regularly fix the edges of walkways and driveways and fix any cracks. Remove anything that might make you trip as you walk through a door, such as a raised step or threshold. Trim any bushes or trees on the path to your home. Use bright outdoor lighting. Clear any walking paths of anything that might make someone trip, such as rocks or tools. Regularly check to see if handrails are loose or broken. Make sure that both sides of any steps have handrails. Any raised decks and porches should have guardrails on the edges. Have any leaves, snow, or ice cleared regularly. Use sand or salt on walking paths during winter. Clean up any spills in your garage right away. This includes oil or grease spills. What can I do in the bathroom? Use night lights. Install grab bars by the toilet and in the tub and shower. Do not use towel bars as  grab bars. Use non-skid mats or decals in the tub or shower. If you need to sit down in the shower, use a plastic, non-slip stool. Keep the floor dry. Clean up any water that spills on the floor as soon as it happens. Remove soap buildup in the tub or shower regularly. Attach bath mats securely with double-sided non-slip rug tape. Do not have throw rugs and other things on the floor that can make you trip. What can I do in the bedroom? Use night lights. Make sure that you have a light by your bed that is easy to reach. Do not use any sheets or blankets that are too big for your bed. They should not hang down onto the floor. Have a firm chair that has side arms. You can use this for support while you get dressed. Do not have throw rugs and other things on the floor that can make you trip. What can I do in the kitchen? Clean up any spills right away. Avoid walking on wet floors. Keep items that you use a lot in easy-to-reach places. If you need to reach something above  you, use a strong step stool that has a grab bar. Keep electrical cords out of the way. Do not use floor polish or wax that makes floors slippery. If you must use wax, use non-skid floor wax. Do not have throw rugs and other things on the floor that can make you trip. What can I do with my stairs? Do not leave any items on the stairs. Make sure that there are handrails on both sides of the stairs and use them. Fix handrails that are broken or loose. Make sure that handrails are as long as the stairways. Check any carpeting to make sure that it is firmly attached to the stairs. Fix any carpet that is loose or worn. Avoid having throw rugs at the top or bottom of the stairs. If you do have throw rugs, attach them to the floor with carpet tape. Make sure that you have a light switch at the top of the stairs and the bottom of the stairs. If you do not have them, ask someone to add them for you. What else can I do to help prevent  falls? Wear shoes that: Do not have high heels. Have rubber bottoms. Are comfortable and fit you well. Are closed at the toe. Do not wear sandals. If you use a stepladder: Make sure that it is fully opened. Do not climb a closed stepladder. Make sure that both sides of the stepladder are locked into place. Ask someone to hold it for you, if possible. Clearly mark and make sure that you can see: Any grab bars or handrails. First and last steps. Where the edge of each step is. Use tools that help you move around (mobility aids) if they are needed. These include: Canes. Walkers. Scooters. Crutches. Turn on the lights when you go into a dark area. Replace any light bulbs as soon as they burn out. Set up your furniture so you have a clear path. Avoid moving your furniture around. If any of your floors are uneven, fix them. If there are any pets around you, be aware of where they are. Review your medicines with your doctor. Some medicines can make you feel dizzy. This can increase your chance of falling. Ask your doctor what other things that you can do to help prevent falls. This information is not intended to replace advice given to you by your health care provider. Make sure you discuss any questions you have with your health care provider. Document Released: 07/18/2009 Document Revised: 02/27/2016 Document Reviewed: 10/26/2014 Elsevier Interactive Patient Education  2017 [employer].

## 2022-04-20 DIAGNOSIS — E1069 Type 1 diabetes mellitus with other specified complication: Secondary | ICD-10-CM | POA: Diagnosis not present

## 2022-04-20 DIAGNOSIS — E785 Hyperlipidemia, unspecified: Secondary | ICD-10-CM | POA: Diagnosis not present

## 2022-04-20 DIAGNOSIS — E10649 Type 1 diabetes mellitus with hypoglycemia without coma: Secondary | ICD-10-CM | POA: Diagnosis not present

## 2022-04-21 DIAGNOSIS — E785 Hyperlipidemia, unspecified: Secondary | ICD-10-CM | POA: Diagnosis not present

## 2022-04-21 DIAGNOSIS — I10 Essential (primary) hypertension: Secondary | ICD-10-CM | POA: Diagnosis not present

## 2022-04-21 DIAGNOSIS — E109 Type 1 diabetes mellitus without complications: Secondary | ICD-10-CM | POA: Diagnosis not present

## 2022-05-12 ENCOUNTER — Telehealth: Payer: Self-pay | Admitting: Primary Care

## 2022-05-12 NOTE — Telephone Encounter (Signed)
Noted, will evaluate. 

## 2022-05-12 NOTE — Telephone Encounter (Signed)
Patient daughter Brooke Morse called in and was requesting that a urine sample be taking based on something's that was discussed with [facility]. Ionna has been experiencing dizziness, weakness, and forgetfulness causing her to take insulin twice and blood sugar was low last night. Thank you!

## 2022-05-12 NOTE — Telephone Encounter (Signed)
Called daughter added on for tomorrow at 320

## 2022-05-13 ENCOUNTER — Ambulatory Visit (INDEPENDENT_AMBULATORY_CARE_PROVIDER_SITE_OTHER): Payer: Medicare Other | Admitting: Primary Care

## 2022-05-13 ENCOUNTER — Encounter: Payer: Self-pay | Admitting: Primary Care

## 2022-05-13 VITALS — BP 140/78 | HR 59 | Temp 98.6°F | Ht 66.5 in | Wt 144.0 lb

## 2022-05-13 DIAGNOSIS — R3129 Other microscopic hematuria: Secondary | ICD-10-CM

## 2022-05-13 DIAGNOSIS — R6889 Other general symptoms and signs: Secondary | ICD-10-CM

## 2022-05-13 DIAGNOSIS — R2689 Other abnormalities of gait and mobility: Secondary | ICD-10-CM

## 2022-05-13 DIAGNOSIS — E785 Hyperlipidemia, unspecified: Secondary | ICD-10-CM

## 2022-05-13 DIAGNOSIS — R5383 Other fatigue: Secondary | ICD-10-CM | POA: Diagnosis not present

## 2022-05-13 LAB — POC URINALSYSI DIPSTICK (AUTOMATED)
Bilirubin, UA: NEGATIVE
Glucose, UA: NEGATIVE
Ketones, UA: NEGATIVE
Leukocytes, UA: NEGATIVE
Nitrite, UA: NEGATIVE
Protein, UA: NEGATIVE
Spec Grav, UA: <=1.005 — AB (ref 1.010–1.025)
Urobilinogen, UA: 0.2 E.U./dL
pH, UA: 6 (ref 5.0–8.0)

## 2022-05-13 NOTE — Progress Notes (Unsigned)
Subjective:    Patient ID: Brooke Morse, female    DOB: 1946-04-14, 76 y.o.   MRN: [id number]  HPI  FRANCES AMBROSINO is a very pleasant 76 y.o. female with a history of hypertension, type 1 diabetes, hyperlipidemia, dizziness, imbalance who presents today to discuss fatigue, continued imbalance, and memory concerns.   Her daughter brings her today who is helping with HPI.   Symptoms include feeling fatigued, little motivation to do anything, feeling sluggish, worrying, thinking worst case scenario. Chronic symptoms over time. She spends most of her time at home, watches TV mostly during her day. Denies feeling sad/down. She recently resumed sewing as a hobby.  She completed physical therapy recently for imbalance and unsteady gait. She really enjoyed PT, felt that she improved some but feels that she could benefit from continued therapy. She still has lower confidence with standing and as she is afraid she may fall. Her daughter sees her holding onto walls and objects when walking. She is now up to walking 1 mile daily on her treadmill.   Two weeks ago she noticed a sore to the right labia majora, came to a head, burst open, red drainage, she believes this has healed since.   Her daughter is concerned about forgetfulness. She will notice her mother forgetting where she put objects. Two days ago she forgot that she took her insulin and accidentally doubled on her dose. Her daughter denies dangerous behavior such as leaving the stove on or wandering. She recently began driving again and has done well. She denies a family history of dementia, chest pain, shortness of breath, lower extremity edema.    Review of Systems  Constitutional:  Positive for fatigue.  Respiratory:  Negative for shortness of breath.   Cardiovascular:  Negative for chest pain and leg swelling.  Genitourinary:  Negative for hematuria and vaginal discharge.       Vaginal sore  Musculoskeletal:        Unsteady gait   Neurological:  Negative for dizziness.  Psychiatric/Behavioral:  Negative for confusion. The patient is nervous/anxious.        See HPI         Past Medical History:  Diagnosis Date   Diabetes mellitus without complication ([place])    Hyperlipemia     Social History   Socioeconomic History   Marital status: Divorced    Spouse name: Not on file   Number of children: Not on file   Years of education: Not on file   Highest education level: Not on file  Occupational History   Not on file  Tobacco Use   Smoking status: Never   Smokeless tobacco: Never  Vaping Use   Vaping Use: Never used  Substance and Sexual Activity   Alcohol use: No   Drug use: No   Sexual activity: Not on file  Other Topics Concern   Not on file  Social History Narrative   Divorced.   2 children, 5 grandchildren.   Retired. Once worked at a cigarette factory.   Enjoys sewing, cooking, walks 2 miles daily.    Social Determinants of Health   Financial Resource Strain: Low Risk  (04/08/2022)   Overall Financial Resource Strain (CARDIA)    Difficulty of Paying Living Expenses: Not hard at all  Food Insecurity: No Food Insecurity (04/08/2022)   Hunger Vital Sign    Worried About Running Out of Food in the Last Year: Never true    Ran Out of Food in  the Last Year: Never true  Transportation Needs: No Transportation Needs (04/08/2022)   PRAPARE - Hydrologist (Medical): No    Lack of Transportation (Non-Medical): No  Physical Activity: Sufficiently Active (04/08/2022)   Exercise Vital Sign    Days of Exercise per Week: 5 days    Minutes of Exercise per Session: 30 min  Stress: No Stress Concern Present (04/08/2022)   [place]    Feeling of Stress : Not at all  Social Connections: Socially Isolated (04/08/2022)   Social Connection and Isolation Panel [NHANES]    Frequency of Communication with Friends and Family:  More than three times a week    Frequency of Social Gatherings with Friends and Family: More than three times a week    Attends Religious Services: Never    Marine scientist or Organizations: No    Attends Archivist Meetings: Never    Marital Status: Divorced  Human resources officer Violence: Not At Risk (04/08/2022)   Humiliation, Afraid, Rape, and Kick questionnaire    Fear of Current or Ex-Partner: No    Emotionally Abused: No    Physically Abused: No    Sexually Abused: No    Past Surgical History:  Procedure Laterality Date   bunion removal     COLONOSCOPY WITH PROPOFOL N/A 03/18/2015   Procedure: COLONOSCOPY WITH PROPOFOL;  Surgeon: Hulen Luster, MD;  Location: [facility] ENDOSCOPY;  Service: Gastroenterology;  Laterality: N/A;    Family History  Problem Relation Age of Onset   Colon cancer Father    Asthma Father     Allergies  Allergen Reactions   Peanut-Containing Drug Products Itching    Current Outpatient Medications on File Prior to Visit  Medication Sig Dispense Refill   ascorbic acid (VITAMIN C) 1000 MG tablet Take by mouth.     aspirin 81 MG tablet Take 81 mg by mouth. About 2 times a week     atorvastatin (LIPITOR) 20 MG tablet Take 20 mg by mouth. 2 a week     Fiber, Guar Gum, CHEW Chew 1 tablet by mouth daily.     insulin lispro (HUMALOG) 100 UNIT/ML KiwkPen Inject 12 Units as directed 3 (three) times daily.     Insulin Pen Needle (FIFTY50 PEN NEEDLES) 32G X 4 MM MISC USE FOUR TIMES A DAY AS DIRECTED.     Lancets (ONETOUCH DELICA PLUS [id number]) MISC USE TO TEST BLOOD SUGAR FOUR TIMES DAILY AS INSTRUCTED     Multiple Vitamin (MULTIVITAMIN WITH MINERALS) TABS tablet Take 1 tablet by mouth daily.     Omega-3 Fatty Acids (OMEGA-3 1450 PO) Take by mouth.     ONETOUCH ULTRA test strip USE 1 STRIP TO CHECK GLUCOSE 4 TIMES DAILY     No current facility-administered medications on file prior to visit.    BP (!) 140/78   Pulse (!) 59   Temp 98.6 F (37 C)  (Oral)   Ht 5' 6.5" (1.689 m)   Wt 144 lb (65.3 kg)   SpO2 97%   BMI 22.89 kg/m  Objective:   Physical Exam Cardiovascular:     Rate and Rhythm: Normal rate and regular rhythm.     Comments: No lower extremity edema noted on exam Pulmonary:     Effort: Pulmonary effort is normal.     Breath sounds: Normal breath sounds.  Abdominal:     Palpations: Abdomen is soft.  Tenderness: There is no abdominal tenderness.  Musculoskeletal:     Cervical back: Neck supple.     Comments: Able to get up and down from exam table without assistance but with hesitancy.  Ambulates close to daughter without assistive device.  Skin:    General: Skin is warm and dry.  Neurological:     Mental Status: She is alert and oriented to person, place, and time.  Psychiatric:        Mood and Affect: Mood normal.           Assessment & Plan:   Problem List Items Addressed This Visit       Genitourinary   Microscopic hematuria    UA today with 2+ blood, otherwise negative. Urine culture pending. Urine microscopic pending.  Consider repeat UA and/or urology consultation if she is negative for cystitis. We will closely monitor this.  Overall lower risk for bladder cancer.      Relevant Orders   POCT Urinalysis Dipstick (Automated) (Completed)   Urine Culture   Urine Microscopic     Other   Hyperlipidemia    Continue atorvastatin 20 mg daily, repeat lipid panel pending.      Imbalance    Improved but not yet at goal. Referral placed to resume home health physical therapy. I do believe that she will benefit.      Relevant Orders   Ambulatory referral to Home Health   Fatigue - Primary    Unclear etiology, differentials include depression/deconditioning/cystitis. After she left I did notice the documented bradycardia with HR of 59.  Upon auscultation during exam she did not sound this low. Last visit was within normal range.  I did screen for depression, she does seem to have  mild symptoms of both anxiety and depression.  We discussed this today.  Labs reviewed from June 2023 which were grossly unremarkable. UA today with 2+ blood, otherwise negative.  Urine culture and microscopic pending.  We will ask daughter to start monitoring heart rate at home.  We will get her active again with home health physical therapy.  Encouraged to continue with regular walking.      Relevant Orders   POCT Urinalysis Dipstick (Automated) (Completed)   Vitamin B12   Forgetfulness    Unclear if this is dementia versus another cause. She was able to hold a full conversation today without problems.  Continue to monitor. Labs pending.      Relevant Orders   Vitamin B12       Pleas Koch, NP

## 2022-05-13 NOTE — Assessment & Plan Note (Signed)
Continue atorvastatin 20 mg daily, repeat lipid panel pending.

## 2022-05-13 NOTE — Assessment & Plan Note (Signed)
Improved but not yet at goal. Referral placed to resume home health physical therapy. I do believe that she will benefit.

## 2022-05-13 NOTE — Assessment & Plan Note (Signed)
Unclear if this is dementia versus another cause. She was able to hold a full conversation today without problems.  Continue to monitor. Labs pending.

## 2022-05-13 NOTE — Assessment & Plan Note (Addendum)
Unclear etiology, differentials include depression/deconditioning/cystitis. After she left I did notice the documented bradycardia with HR of 59.  Upon auscultation during exam she did not sound this low. Last visit was within normal range.  I did screen for depression, she does seem to have mild symptoms of both anxiety and depression.  We discussed this today.  Labs reviewed from June 2023 which were grossly unremarkable. UA today with 2+ blood, otherwise negative.  Urine culture and microscopic pending.  We will ask daughter to start monitoring heart rate at home.  We will get her active again with home health physical therapy.  Encouraged to continue with regular walking.

## 2022-05-13 NOTE — Patient Instructions (Addendum)
We will be in touch on Friday with your urine test results.  You will be contacted regarding your referral to home health physical therapy.  Please let us know if you have not been contacted within two weeks.   Continue to increase your activity level during the day.  It was a pleasure to see you today!

## 2022-05-13 NOTE — Assessment & Plan Note (Signed)
UA today with 2+ blood, otherwise negative. Urine culture pending. Urine microscopic pending.  Consider repeat UA and/or urology consultation if she is negative for cystitis. We will closely monitor this.  Overall lower risk for bladder cancer.

## 2022-05-14 LAB — URINALYSIS, MICROSCOPIC ONLY

## 2022-05-14 LAB — URINE CULTURE
MICRO NUMBER:: 13756466
Result:: NO GROWTH
SPECIMEN QUALITY:: ADEQUATE

## 2022-05-15 ENCOUNTER — Other Ambulatory Visit: Payer: Self-pay | Admitting: Primary Care

## 2022-05-15 DIAGNOSIS — R3129 Other microscopic hematuria: Secondary | ICD-10-CM

## 2022-05-18 ENCOUNTER — Other Ambulatory Visit: Payer: Medicare Other

## 2022-05-19 ENCOUNTER — Other Ambulatory Visit (INDEPENDENT_AMBULATORY_CARE_PROVIDER_SITE_OTHER): Payer: Medicare Other

## 2022-05-19 DIAGNOSIS — R3129 Other microscopic hematuria: Secondary | ICD-10-CM | POA: Diagnosis not present

## 2022-05-19 DIAGNOSIS — E785 Hyperlipidemia, unspecified: Secondary | ICD-10-CM

## 2022-05-19 DIAGNOSIS — R6889 Other general symptoms and signs: Secondary | ICD-10-CM

## 2022-05-19 DIAGNOSIS — R5383 Other fatigue: Secondary | ICD-10-CM

## 2022-05-19 LAB — URINALYSIS, ROUTINE W REFLEX MICROSCOPIC
Bilirubin Urine: NEGATIVE
Hgb urine dipstick: NEGATIVE
Ketones, ur: 15 — AB
Leukocytes,Ua: NEGATIVE
Nitrite: NEGATIVE
RBC / HPF: NONE SEEN (ref 0–?)
Specific Gravity, Urine: 1.02 (ref 1.000–1.030)
Total Protein, Urine: NEGATIVE
Urine Glucose: NEGATIVE
Urobilinogen, UA: 1 (ref 0.0–1.0)
pH: 6 (ref 5.0–8.0)

## 2022-05-19 LAB — LIPID PANEL
Cholesterol: 213 mg/dL — ABNORMAL HIGH (ref 0–200)
HDL: 72.4 mg/dL (ref >39.00)
LDL Cholesterol: 126 mg/dL — ABNORMAL HIGH (ref 0–99)
NonHDL: 140.44
Total CHOL/HDL Ratio: 3
Triglycerides: 71 mg/dL (ref 0.0–149.0)
VLDL: 14.2 mg/dL (ref 0.0–40.0)

## 2022-05-19 LAB — VITAMIN B12: Vitamin B-12: 239 pg/mL (ref 211–911)

## 2022-05-22 ENCOUNTER — Other Ambulatory Visit: Payer: Self-pay | Admitting: Primary Care

## 2022-05-22 DIAGNOSIS — R3129 Other microscopic hematuria: Secondary | ICD-10-CM

## 2022-06-01 DIAGNOSIS — E785 Hyperlipidemia, unspecified: Secondary | ICD-10-CM | POA: Diagnosis not present

## 2022-06-01 DIAGNOSIS — I10 Essential (primary) hypertension: Secondary | ICD-10-CM | POA: Diagnosis not present

## 2022-06-01 DIAGNOSIS — R2689 Other abnormalities of gait and mobility: Secondary | ICD-10-CM | POA: Diagnosis not present

## 2022-06-01 DIAGNOSIS — Z794 Long term (current) use of insulin: Secondary | ICD-10-CM | POA: Diagnosis not present

## 2022-06-01 DIAGNOSIS — E119 Type 2 diabetes mellitus without complications: Secondary | ICD-10-CM | POA: Diagnosis not present

## 2022-06-01 DIAGNOSIS — R3129 Other microscopic hematuria: Secondary | ICD-10-CM | POA: Diagnosis not present

## 2022-06-01 DIAGNOSIS — R5383 Other fatigue: Secondary | ICD-10-CM | POA: Diagnosis not present

## 2022-06-11 DIAGNOSIS — R2689 Other abnormalities of gait and mobility: Secondary | ICD-10-CM

## 2022-06-11 DIAGNOSIS — R3129 Other microscopic hematuria: Secondary | ICD-10-CM

## 2022-06-11 DIAGNOSIS — R5383 Other fatigue: Secondary | ICD-10-CM | POA: Diagnosis not present

## 2022-06-11 DIAGNOSIS — I10 Essential (primary) hypertension: Secondary | ICD-10-CM | POA: Diagnosis not present

## 2022-06-11 DIAGNOSIS — Z794 Long term (current) use of insulin: Secondary | ICD-10-CM

## 2022-06-11 DIAGNOSIS — E785 Hyperlipidemia, unspecified: Secondary | ICD-10-CM | POA: Diagnosis not present

## 2022-06-11 DIAGNOSIS — E119 Type 2 diabetes mellitus without complications: Secondary | ICD-10-CM | POA: Diagnosis not present

## 2022-08-25 ENCOUNTER — Other Ambulatory Visit (INDEPENDENT_AMBULATORY_CARE_PROVIDER_SITE_OTHER): Payer: Medicare Other

## 2022-08-25 DIAGNOSIS — R3129 Other microscopic hematuria: Secondary | ICD-10-CM | POA: Diagnosis not present

## 2022-08-25 LAB — URINALYSIS, ROUTINE W REFLEX MICROSCOPIC
Bilirubin Urine: NEGATIVE
Hgb urine dipstick: NEGATIVE
Leukocytes,Ua: NEGATIVE
Nitrite: NEGATIVE
Specific Gravity, Urine: 1.025 (ref 1.000–1.030)
Total Protein, Urine: NEGATIVE
Urine Glucose: NEGATIVE
Urobilinogen, UA: 1 (ref 0.0–1.0)
pH: 6 (ref 5.0–8.0)

## 2022-10-01 DIAGNOSIS — R2689 Other abnormalities of gait and mobility: Secondary | ICD-10-CM

## 2022-10-01 DIAGNOSIS — F32A Depression, unspecified: Secondary | ICD-10-CM

## 2022-10-01 DIAGNOSIS — R6889 Other general symptoms and signs: Secondary | ICD-10-CM

## 2022-10-14 ENCOUNTER — Ambulatory Visit (INDEPENDENT_AMBULATORY_CARE_PROVIDER_SITE_OTHER): Payer: Medicare Other | Admitting: Primary Care

## 2022-10-14 ENCOUNTER — Encounter: Payer: Self-pay | Admitting: Primary Care

## 2022-10-14 VITALS — BP 138/82 | HR 64 | Temp 97.3°F | Ht 66.5 in | Wt 148.0 lb

## 2022-10-14 DIAGNOSIS — R6889 Other general symptoms and signs: Secondary | ICD-10-CM

## 2022-10-14 DIAGNOSIS — R2689 Other abnormalities of gait and mobility: Secondary | ICD-10-CM

## 2022-10-14 DIAGNOSIS — F419 Anxiety disorder, unspecified: Secondary | ICD-10-CM | POA: Diagnosis not present

## 2022-10-14 DIAGNOSIS — R2681 Unsteadiness on feet: Secondary | ICD-10-CM

## 2022-10-14 DIAGNOSIS — F32A Depression, unspecified: Secondary | ICD-10-CM

## 2022-10-14 NOTE — Progress Notes (Signed)
Established Patient Office Visit  Subjective   Patient ID: Brooke Morse, female    DOB: 1946-01-15  Age: 77 y.o. MRN: [id number]  Chief Complaint  Patient presents with   Memory changes    Would like to discuss slight memory changes and re certifying for in home physical therapy.     HPI  Brooke Morse is a 77 year old female with past medical history of hypertension, Type 1 diabetes, hyperlipidemia, forgetfulness presents today to discuss  Her daughter sent a mychart message with concerns related to her mother's forgetfulness and physical decline.   She was seen in August to discuss imbalance and forgetfulness. At that time, she did have mild symptoms of anxiety and depression.   She is here with her daughter who lives with her since last year.   She still experiencing balance problems. She still reaching for things to hold while walking. She tries to be more careful. She does not feel that that it was beneficial. She was seen once a week however ,she did not do the exercises the rest of the week after that. She does use a cane and find it beneficial to help her with her balance. Her daughter reports that patient is leaning over when she is standing and feels that she may benefit from a walker. They are interested in getting more in home-health therapy and get some tips of daily activities such as getting in and out of the shower and around the house and getting in and out of the car and maneuvering around the house. She has had one fall where she was looking for something under the bed and her arms gave out and she fell face down on the floor. She denies any injuries and didn't go to the doctor/hospital after it.   Forgetfulness: Patient states that there is no difference in her memory. She does forget her insulin after meals at times. Reports that it happens about two days a week when she forgets to take her insulin. She takes her PO medications without missing any doses. She does walk on  1 mile on the treadmill 5 times a week. Her daughter reports that patient has asked her to repeat some information. She has noticed that her mother frustrated easily. She has noticed that she has helped her on the computer. She has noticed that she has noticed she has had to help her with putting her pants on and helping her getting in the car. She is still driving. She has not forgotten any addresses or has gotten lost.   She does report some insomnia. She does fall asleep watching television during the day and reports that it could from anywhere from 20 mins to 2 hours. She goes to bed around 11 pm and wakes up at 10 am. She does wake up 10-15 times at night to urinate. She tries to drink nothing after 7 pm. She does not drink any caffeine at all. She does worry a lot at night when she lays down about her children and grandchildren.   Patient Active Problem List   Diagnosis Date Noted   Anxiety and depression 10/14/2022   Fatigue 05/13/2022   Microscopic hematuria 05/13/2022   Forgetfulness 05/13/2022   Imbalance 03/05/2022   Dizziness 03/05/2022   Environmental allergies 04/25/2019   Preventative health care 01/19/2018   Hyperlipidemia 11/30/2017   Type 1 diabetes ([place]) 11/30/2017   Essential hypertension 11/30/2017   Past Medical History:  Diagnosis Date   Diabetes mellitus  without complication ([place])    Hyperlipemia    Past Surgical History:  Procedure Laterality Date   bunion removal     COLONOSCOPY WITH PROPOFOL N/A 03/18/2015   Procedure: COLONOSCOPY WITH PROPOFOL;  Surgeon: Hulen Luster, MD;  Location: [facility] ENDOSCOPY;  Service: Gastroenterology;  Laterality: N/A;   Social History   Tobacco Use   Smoking status: Never   Smokeless tobacco: Never  Vaping Use   Vaping Use: Never used  Substance Use Topics   Alcohol use: No   Drug use: No   Family History  Problem Relation Age of Onset   Colon cancer Father    Asthma Father    Allergies  Allergen Reactions    Peanut-Containing Drug Products Itching      Review of Systems  Constitutional:  Positive for malaise/fatigue.  Respiratory:  Negative for shortness of breath.   Cardiovascular:  Negative for chest pain.  Genitourinary:  Positive for frequency.  Musculoskeletal:  Negative for joint pain and myalgias.  Neurological:  Negative for dizziness, weakness and headaches.  Psychiatric/Behavioral:  Negative for depression. The patient has insomnia. The patient is not nervous/anxious.       Objective:     BP 138/82   Pulse 64   Temp (!) 97.3 F (36.3 C) (Temporal)   Ht 5' 6.5" (1.689 m)   Wt 148 lb (67.1 kg)   SpO2 98%   BMI 23.53 kg/m  BP Readings from Last 3 Encounters:  10/14/22 138/82  05/13/22 (!) 140/78  03/05/22 140/76   Wt Readings from Last 3 Encounters:  10/14/22 148 lb (67.1 kg)  05/13/22 144 lb (65.3 kg)  04/08/22 145 lb (65.8 kg)      Physical Exam Vitals and nursing note reviewed.  Cardiovascular:     Rate and Rhythm: Normal rate and regular rhythm.     Pulses: Normal pulses.     Heart sounds: Normal heart sounds.  Pulmonary:     Effort: Pulmonary effort is normal.     Breath sounds: Normal breath sounds.  Neurological:     Mental Status: She is oriented to person, place, and time.  Psychiatric:        Mood and Affect: Mood normal.        Behavior: Behavior normal.        Thought Content: Thought content normal.        Judgment: Judgment normal.      No results found for any visits on 10/14/22.     The 10-year ASCVD risk score (Arnett DK, et al., 2019) is: 41.3%    Assessment & Plan:   Problem List Items Addressed This Visit       Other   Imbalance - Primary    Uncontrolled. Believe that she will benefit from restarting physical therapy.   Referral placed to resume home health physical therapy and rollator walker with seat.         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   Mini mental status exam score 29. She was able to hold a full conversation and is a good historian.   Long discussion with patient and her daughter about patient's anxiety and depression along with some forgetfulness.   Discussed starting low dose Namenda to help with progression of worsening memory loss.   She will update [place] when she's ready to  start the medication.      Anxiety and depression    PHQ 9 score is 6 today.   Believe some underlying anxiety and depression could be contributing to her memory loss.   Long discussion with patient and her daughter about starting low dose Citalopram to help with her symptoms and sleep.   She will update [place] when she is ready to start this medication.       Other Visit Diagnoses     Unsteady gait       Relevant Orders   Ambulatory referral to [facility] use only DME 4 wheeled rolling walker with seat ([id number])       No follow-ups on file.    Tinnie Gens, BSN-RN, DNP STUDENT

## 2022-10-14 NOTE — Patient Instructions (Addendum)
Referral has been placed for your homehealth therapy and the walker. You should hear back in two weeks. If you don't hear anything on my chart or via phone; please let us know.   The handicap plackcard form has been filled; you will fill out the top section and take it to the tag agency.   Please let us know if you would like to start the memory medication (Namenda 5 mg) or Citalopram ('10mg'$ ) which is the anxiety/depression medication.   It was a pleasure to see you today!

## 2022-10-14 NOTE — Assessment & Plan Note (Addendum)
PHQ 9 score is 6 today.   Believe some underlying anxiety and depression could be contributing to some of her symptoms.   Long discussion with patient and her daughter about options. Considering Namenda vs citalopram. They would like to discuss first. She will update.  I evaluated patient, was consulted regarding treatment, and agree with assessment and plan per Tinnie Gens, RN, DNP student.   Allie Bossier, NP-C

## 2022-10-14 NOTE — Progress Notes (Signed)
Subjective:    Patient ID: Brooke Morse, female    DOB: 1946/03/05, 77 y.o.   MRN: [id number]  HPI  Brooke Morse is a very pleasant 77 y.o. female  has a past medical history of Diabetes mellitus without complication ([place]) and Hyperlipemia., imbalance, fatigue, dizziness who presents today to discuss imbalance and mental health. Her daughter joins [place] today who helps to provide information for HPI.  She was last evaluated in August 2023 for symptoms of fatigue, little motivation to do anything, feeling sluggish, worrying, decline in overall physical activity, unsteady gait. During this visit we placed a home health referral for imbalance, completed labs, and anxiety/depression screening.   Since her last visit her balance, coordination, and overall strength have declined. She was once able to walk 2 miles on a treadmill with incline and now can hardly get through 1/4 mile. She is also relying on leaning against objects when walking, forgetting recent conversations, difficulty coordinating the use of a cane. She did have a incidence of her arms giving out while she was on her hands and knees looking for something under the bed. Her daughter is requesting a walker for better balance due to improper coordination with a cane.   She continues to drive, has never forgotten how to get back  home. Her daughter has to help her with tasks that her mother could previously do on her own. For example, has ot help her mother put on pants, help her with computer use.   Her daughter is requesting cognitive testing and another referral for home health physical therapy to help with strength with ADL's. When referred to home health PT in August 2023, she found this helpful but did not do well to comply to homework in between PT visits. She is motivated to try again.   She goes to bed around 11pm, she is getting up 10-15 times during the night to urinate, sometimes has trouble going back to sleep. She limits liquids  before bed. She does nap everyday anywhere from 20 min to 2 hours. She lays awake at night worrying about her children and grandchildren.   Review of Systems  Constitutional:  Positive for fatigue.  Genitourinary:  Negative for frequency.  Musculoskeletal:        Altered gait  Neurological:  Positive for weakness.  Psychiatric/Behavioral:  Positive for sleep disturbance. The patient is nervous/anxious.          Past Medical History:  Diagnosis Date   Diabetes mellitus without complication ([place])    Hyperlipemia     Social History   Socioeconomic History   Marital status: Divorced    Spouse name: Not on file   Number of children: Not on file   Years of education: Not on file   Highest education level: Not on file  Occupational History   Not on file  Tobacco Use   Smoking status: Never   Smokeless tobacco: Never  Vaping Use   Vaping Use: Never used  Substance and Sexual Activity   Alcohol use: No   Drug use: No   Sexual activity: Not on file  Other Topics Concern   Not on file  Social History Narrative   Divorced.   2 children, 5 grandchildren.   Retired. Once worked at a cigarette factory.   Enjoys sewing, cooking, walks 2 miles daily.    Social Determinants of Health   Financial Resource Strain: Low Risk  (04/08/2022)   Overall Financial Resource Strain (CARDIA)  Difficulty of Paying Living Expenses: Not hard at all  Food Insecurity: No Food Insecurity (04/08/2022)   Hunger Vital Sign    Worried About Running Out of Food in the Last Year: Never true    Ran Out of Food in the Last Year: Never true  Transportation Needs: No Transportation Needs (04/08/2022)   PRAPARE - Hydrologist (Medical): No    Lack of Transportation (Non-Medical): No  Physical Activity: Sufficiently Active (04/08/2022)   Exercise Vital Sign    Days of Exercise per Week: 5 days    Minutes of Exercise per Session: 30 min  Stress: No Stress Concern Present  (04/08/2022)   [place]    Feeling of Stress : Not at all  Social Connections: Socially Isolated (04/08/2022)   Social Connection and Isolation Panel [NHANES]    Frequency of Communication with Friends and Family: More than three times a week    Frequency of Social Gatherings with Friends and Family: More than three times a week    Attends Religious Services: Never    Marine scientist or Organizations: No    Attends Archivist Meetings: Never    Marital Status: Divorced  Human resources officer Violence: Not At Risk (04/08/2022)   Humiliation, Afraid, Rape, and Kick questionnaire    Fear of Current or Ex-Partner: No    Emotionally Abused: No    Physically Abused: No    Sexually Abused: No    Past Surgical History:  Procedure Laterality Date   bunion removal     COLONOSCOPY WITH PROPOFOL N/A 03/18/2015   Procedure: COLONOSCOPY WITH PROPOFOL;  Surgeon: Hulen Luster, MD;  Location: [facility] ENDOSCOPY;  Service: Gastroenterology;  Laterality: N/A;    Family History  Problem Relation Age of Onset   Colon cancer Father    Asthma Father     Allergies  Allergen Reactions   Peanut-Containing Drug Products Itching    Current Outpatient Medications on File Prior to Visit  Medication Sig Dispense Refill   ascorbic acid (VITAMIN C) 1000 MG tablet Take by mouth.     aspirin 81 MG tablet Take 81 mg by mouth. About 2 times a week     atorvastatin (LIPITOR) 20 MG tablet Take 20 mg by mouth. 2 a week     Fiber, Guar Gum, CHEW Chew 1 tablet by mouth daily.     insulin lispro (HUMALOG) 100 UNIT/ML KiwkPen Inject 12 Units as directed 3 (three) times daily.     Insulin Pen Needle (FIFTY50 PEN NEEDLES) 32G X 4 MM MISC USE FOUR TIMES A DAY AS DIRECTED.     Lancets (ONETOUCH DELICA PLUS [id number]) MISC USE TO TEST BLOOD SUGAR FOUR TIMES DAILY AS INSTRUCTED     METAMUCIL FIBER PO Take by mouth.     Multiple Vitamin (MULTIVITAMIN WITH  MINERALS) TABS tablet Take 1 tablet by mouth daily.     Omega-3 Fatty Acids (OMEGA-3 1450 PO) Take by mouth.     ONETOUCH ULTRA test strip USE 1 STRIP TO CHECK GLUCOSE 4 TIMES DAILY     No current facility-administered medications on file prior to visit.    BP 138/82   Pulse 64   Temp (!) 97.3 F (36.3 C) (Temporal)   Ht 5' 6.5" (1.689 m)   Wt 148 lb (67.1 kg)   SpO2 98%   BMI 23.53 kg/m  Objective:   Physical Exam Cardiovascular:  Rate and Rhythm: Normal rate and regular rhythm.  Pulmonary:     Effort: Pulmonary effort is normal.     Breath sounds: Normal breath sounds.  Skin:    General: Skin is warm and dry.  Neurological:     Mental Status: She is alert and oriented to person, place, and time.     Comments: Participates fully in HPI  Psychiatric:        Mood and Affect: Mood normal.           Assessment & Plan:  Imbalance Assessment & Plan: Uncontrolled. Believe that she will benefit from restarting physical therapy and for Rolator with seat.   Referral placed to resume home health physical therapy and DME order for Rolator walker with seat placed.  I evaluated patient, was consulted regarding treatment, and agree with assessment and plan per Tinnie Gens, RN, DNP student.   Allie Bossier, NP-C     Orders: -     Ambulatory referral to [facility] -     For home use only DME 4 wheeled rolling walker with seat  Unsteady gait -     Ambulatory referral to [place] -     For home use only DME 4 wheeled rolling walker with seat  Forgetfulness Assessment & Plan: Symptoms suggestive of mild dementia.   Mini mental status exam score 29. She was able to hold a full conversation and is a good historian.   Long discussion with patient and her daughter about patient's anxiety and depression along with some forgetfulness.   Discussed starting low dose Namenda to help with progression of worsening memory loss vs citalopram for anxiety. They would like to  think about this.  She will update.  I evaluated patient, was consulted regarding treatment, and agree with assessment and plan per Tinnie Gens, RN, DNP student.   Allie Bossier, NP-C    Anxiety and depression Assessment & Plan: PHQ 9 score is 6 today.   Believe some underlying anxiety and depression could be contributing to some of her symptoms.   Long discussion with patient and her daughter about options. Considering Namenda vs citalopram. They would like to discuss first. She will update.  I evaluated patient, was consulted regarding treatment, and agree with assessment and plan per Tinnie Gens, RN, DNP student.   Allie Bossier, NP-C          Pleas Koch, NP

## 2022-10-14 NOTE — Assessment & Plan Note (Addendum)
Uncontrolled. Believe that she will benefit from restarting physical therapy and for Rolator with seat.   Referral placed to resume home health physical therapy and DME order for Rolator walker with seat placed.  I evaluated patient, was consulted regarding treatment, and agree with assessment and plan per Tinnie Gens, RN, DNP student.   Allie Bossier, NP-C

## 2022-10-14 NOTE — Assessment & Plan Note (Addendum)
Symptoms suggestive of mild dementia.   Mini mental status exam score 29. She was able to hold a full conversation and is a good historian.   Long discussion with patient and her daughter about patient's anxiety and depression along with some forgetfulness.   Discussed starting low dose Namenda to help with progression of worsening memory loss vs citalopram for anxiety. They would like to think about this.  She will update.  I evaluated patient, was consulted regarding treatment, and agree with assessment and plan per Tinnie Gens, RN, DNP student.   Allie Bossier, NP-C

## 2022-10-19 ENCOUNTER — Telehealth: Payer: Self-pay | Admitting: Primary Care

## 2022-10-19 DIAGNOSIS — E109 Type 1 diabetes mellitus without complications: Secondary | ICD-10-CM | POA: Diagnosis not present

## 2022-10-19 DIAGNOSIS — F32A Depression, unspecified: Secondary | ICD-10-CM | POA: Diagnosis not present

## 2022-10-19 DIAGNOSIS — E785 Hyperlipidemia, unspecified: Secondary | ICD-10-CM | POA: Diagnosis not present

## 2022-10-19 DIAGNOSIS — G3184 Mild cognitive impairment, so stated: Secondary | ICD-10-CM | POA: Diagnosis not present

## 2022-10-19 DIAGNOSIS — I10 Essential (primary) hypertension: Secondary | ICD-10-CM | POA: Diagnosis not present

## 2022-10-19 DIAGNOSIS — Z9181 History of falling: Secondary | ICD-10-CM | POA: Diagnosis not present

## 2022-10-19 DIAGNOSIS — G4700 Insomnia, unspecified: Secondary | ICD-10-CM | POA: Diagnosis not present

## 2022-10-19 DIAGNOSIS — R2689 Other abnormalities of gait and mobility: Secondary | ICD-10-CM | POA: Diagnosis not present

## 2022-10-19 DIAGNOSIS — F419 Anxiety disorder, unspecified: Secondary | ICD-10-CM | POA: Diagnosis not present

## 2022-10-19 DIAGNOSIS — R531 Weakness: Secondary | ICD-10-CM | POA: Diagnosis not present

## 2022-10-19 DIAGNOSIS — R42 Dizziness and giddiness: Secondary | ICD-10-CM | POA: Diagnosis not present

## 2022-10-19 NOTE — Telephone Encounter (Signed)
Called patients daughter and gave her Brooke Morse's contact information to call and set up delivery of the rollator.

## 2022-10-19 NOTE — Telephone Encounter (Signed)
[name] from [facility] called to let Brooke Morse know they received the prescription for the rollator, for the pt. Brooke Morse stated they've attempted the contact the pt multiple times & haven't been successful with reaching the pt yet.  Call back # [phone]

## 2022-10-20 DIAGNOSIS — E785 Hyperlipidemia, unspecified: Secondary | ICD-10-CM | POA: Diagnosis not present

## 2022-10-20 DIAGNOSIS — E10649 Type 1 diabetes mellitus with hypoglycemia without coma: Secondary | ICD-10-CM | POA: Diagnosis not present

## 2022-10-20 DIAGNOSIS — E1069 Type 1 diabetes mellitus with other specified complication: Secondary | ICD-10-CM | POA: Diagnosis not present

## 2022-10-21 DIAGNOSIS — K080 Exfoliation of teeth due to systemic causes: Secondary | ICD-10-CM | POA: Diagnosis not present

## 2022-10-22 DIAGNOSIS — R2689 Other abnormalities of gait and mobility: Secondary | ICD-10-CM | POA: Diagnosis not present

## 2022-10-22 DIAGNOSIS — G3184 Mild cognitive impairment, so stated: Secondary | ICD-10-CM | POA: Diagnosis not present

## 2022-10-22 DIAGNOSIS — Z9181 History of falling: Secondary | ICD-10-CM | POA: Diagnosis not present

## 2022-10-22 DIAGNOSIS — I10 Essential (primary) hypertension: Secondary | ICD-10-CM | POA: Diagnosis not present

## 2022-10-22 DIAGNOSIS — R42 Dizziness and giddiness: Secondary | ICD-10-CM | POA: Diagnosis not present

## 2022-10-22 DIAGNOSIS — E109 Type 1 diabetes mellitus without complications: Secondary | ICD-10-CM | POA: Diagnosis not present

## 2022-10-22 DIAGNOSIS — R531 Weakness: Secondary | ICD-10-CM | POA: Diagnosis not present

## 2022-10-22 DIAGNOSIS — G4700 Insomnia, unspecified: Secondary | ICD-10-CM | POA: Diagnosis not present

## 2022-10-22 DIAGNOSIS — F32A Depression, unspecified: Secondary | ICD-10-CM | POA: Diagnosis not present

## 2022-10-22 DIAGNOSIS — F419 Anxiety disorder, unspecified: Secondary | ICD-10-CM | POA: Diagnosis not present

## 2022-10-22 DIAGNOSIS — E785 Hyperlipidemia, unspecified: Secondary | ICD-10-CM | POA: Diagnosis not present

## 2022-10-22 MED ORDER — CITALOPRAM HYDROBROMIDE 20 MG PO TABS: 20.0000 mg | ORAL_TABLET | Freq: Every day | ORAL | 0 refills | Status: DC

## 2022-10-23 DIAGNOSIS — K080 Exfoliation of teeth due to systemic causes: Secondary | ICD-10-CM | POA: Diagnosis not present

## 2022-10-26 DIAGNOSIS — I10 Essential (primary) hypertension: Secondary | ICD-10-CM | POA: Diagnosis not present

## 2022-10-26 DIAGNOSIS — E785 Hyperlipidemia, unspecified: Secondary | ICD-10-CM | POA: Diagnosis not present

## 2022-10-26 DIAGNOSIS — R2689 Other abnormalities of gait and mobility: Secondary | ICD-10-CM | POA: Diagnosis not present

## 2022-10-26 DIAGNOSIS — F419 Anxiety disorder, unspecified: Secondary | ICD-10-CM | POA: Diagnosis not present

## 2022-10-26 DIAGNOSIS — R42 Dizziness and giddiness: Secondary | ICD-10-CM | POA: Diagnosis not present

## 2022-10-26 DIAGNOSIS — R531 Weakness: Secondary | ICD-10-CM | POA: Diagnosis not present

## 2022-10-26 DIAGNOSIS — F32A Depression, unspecified: Secondary | ICD-10-CM | POA: Diagnosis not present

## 2022-10-26 DIAGNOSIS — G4700 Insomnia, unspecified: Secondary | ICD-10-CM | POA: Diagnosis not present

## 2022-10-26 DIAGNOSIS — E109 Type 1 diabetes mellitus without complications: Secondary | ICD-10-CM | POA: Diagnosis not present

## 2022-10-26 DIAGNOSIS — G3184 Mild cognitive impairment, so stated: Secondary | ICD-10-CM | POA: Diagnosis not present

## 2022-10-26 DIAGNOSIS — Z9181 History of falling: Secondary | ICD-10-CM | POA: Diagnosis not present

## 2022-10-30 DIAGNOSIS — R42 Dizziness and giddiness: Secondary | ICD-10-CM | POA: Diagnosis not present

## 2022-10-30 DIAGNOSIS — G3184 Mild cognitive impairment, so stated: Secondary | ICD-10-CM | POA: Diagnosis not present

## 2022-10-30 DIAGNOSIS — I10 Essential (primary) hypertension: Secondary | ICD-10-CM | POA: Diagnosis not present

## 2022-10-30 DIAGNOSIS — F419 Anxiety disorder, unspecified: Secondary | ICD-10-CM | POA: Diagnosis not present

## 2022-10-30 DIAGNOSIS — Z9181 History of falling: Secondary | ICD-10-CM | POA: Diagnosis not present

## 2022-10-30 DIAGNOSIS — R2689 Other abnormalities of gait and mobility: Secondary | ICD-10-CM | POA: Diagnosis not present

## 2022-10-30 DIAGNOSIS — E785 Hyperlipidemia, unspecified: Secondary | ICD-10-CM | POA: Diagnosis not present

## 2022-10-30 DIAGNOSIS — R531 Weakness: Secondary | ICD-10-CM | POA: Diagnosis not present

## 2022-10-30 DIAGNOSIS — F32A Depression, unspecified: Secondary | ICD-10-CM | POA: Diagnosis not present

## 2022-10-30 DIAGNOSIS — G4700 Insomnia, unspecified: Secondary | ICD-10-CM | POA: Diagnosis not present

## 2022-10-30 DIAGNOSIS — E109 Type 1 diabetes mellitus without complications: Secondary | ICD-10-CM | POA: Diagnosis not present

## 2022-11-04 ENCOUNTER — Telehealth: Payer: Self-pay | Admitting: Primary Care

## 2022-11-04 DIAGNOSIS — I10 Essential (primary) hypertension: Secondary | ICD-10-CM | POA: Diagnosis not present

## 2022-11-04 DIAGNOSIS — F419 Anxiety disorder, unspecified: Secondary | ICD-10-CM | POA: Diagnosis not present

## 2022-11-04 DIAGNOSIS — R2689 Other abnormalities of gait and mobility: Secondary | ICD-10-CM | POA: Diagnosis not present

## 2022-11-04 DIAGNOSIS — G3184 Mild cognitive impairment, so stated: Secondary | ICD-10-CM | POA: Diagnosis not present

## 2022-11-04 DIAGNOSIS — F32A Depression, unspecified: Secondary | ICD-10-CM | POA: Diagnosis not present

## 2022-11-04 DIAGNOSIS — E785 Hyperlipidemia, unspecified: Secondary | ICD-10-CM | POA: Diagnosis not present

## 2022-11-04 DIAGNOSIS — E109 Type 1 diabetes mellitus without complications: Secondary | ICD-10-CM | POA: Diagnosis not present

## 2022-11-04 DIAGNOSIS — R531 Weakness: Secondary | ICD-10-CM | POA: Diagnosis not present

## 2022-11-04 DIAGNOSIS — R42 Dizziness and giddiness: Secondary | ICD-10-CM | POA: Diagnosis not present

## 2022-11-04 DIAGNOSIS — G4700 Insomnia, unspecified: Secondary | ICD-10-CM | POA: Diagnosis not present

## 2022-11-04 DIAGNOSIS — Z9181 History of falling: Secondary | ICD-10-CM | POA: Diagnosis not present

## 2022-11-04 NOTE — Telephone Encounter (Signed)
Home Health verbal orders [place] Agency Name: [facility]  Callback number:   Requesting OT/PT/Skilled nursing/Social Work/Speech: OT  Reason:strength ,balance,ADLS,IADL  Frequency:1 x wk for 6 wks  Please forward to [facility] pool or providers CMA

## 2022-11-04 NOTE — Telephone Encounter (Signed)
Approved.  

## 2022-11-04 NOTE — Telephone Encounter (Signed)
Left secure voicemail with Colletta [place] approving verbal orders.

## 2022-11-05 DIAGNOSIS — Z9181 History of falling: Secondary | ICD-10-CM | POA: Diagnosis not present

## 2022-11-05 DIAGNOSIS — G3184 Mild cognitive impairment, so stated: Secondary | ICD-10-CM | POA: Diagnosis not present

## 2022-11-05 DIAGNOSIS — R531 Weakness: Secondary | ICD-10-CM | POA: Diagnosis not present

## 2022-11-05 DIAGNOSIS — G4700 Insomnia, unspecified: Secondary | ICD-10-CM | POA: Diagnosis not present

## 2022-11-05 DIAGNOSIS — F419 Anxiety disorder, unspecified: Secondary | ICD-10-CM | POA: Diagnosis not present

## 2022-11-05 DIAGNOSIS — F32A Depression, unspecified: Secondary | ICD-10-CM | POA: Diagnosis not present

## 2022-11-05 DIAGNOSIS — E785 Hyperlipidemia, unspecified: Secondary | ICD-10-CM | POA: Diagnosis not present

## 2022-11-05 DIAGNOSIS — R2689 Other abnormalities of gait and mobility: Secondary | ICD-10-CM | POA: Diagnosis not present

## 2022-11-05 DIAGNOSIS — R42 Dizziness and giddiness: Secondary | ICD-10-CM | POA: Diagnosis not present

## 2022-11-05 DIAGNOSIS — E109 Type 1 diabetes mellitus without complications: Secondary | ICD-10-CM | POA: Diagnosis not present

## 2022-11-05 DIAGNOSIS — I10 Essential (primary) hypertension: Secondary | ICD-10-CM | POA: Diagnosis not present

## 2022-11-10 DIAGNOSIS — E109 Type 1 diabetes mellitus without complications: Secondary | ICD-10-CM | POA: Diagnosis not present

## 2022-11-10 DIAGNOSIS — G3184 Mild cognitive impairment, so stated: Secondary | ICD-10-CM | POA: Diagnosis not present

## 2022-11-10 DIAGNOSIS — F32A Depression, unspecified: Secondary | ICD-10-CM | POA: Diagnosis not present

## 2022-11-10 DIAGNOSIS — R2689 Other abnormalities of gait and mobility: Secondary | ICD-10-CM | POA: Diagnosis not present

## 2022-11-10 DIAGNOSIS — R42 Dizziness and giddiness: Secondary | ICD-10-CM | POA: Diagnosis not present

## 2022-11-10 DIAGNOSIS — E785 Hyperlipidemia, unspecified: Secondary | ICD-10-CM | POA: Diagnosis not present

## 2022-11-10 DIAGNOSIS — F419 Anxiety disorder, unspecified: Secondary | ICD-10-CM | POA: Diagnosis not present

## 2022-11-10 DIAGNOSIS — R531 Weakness: Secondary | ICD-10-CM | POA: Diagnosis not present

## 2022-11-10 DIAGNOSIS — Z9181 History of falling: Secondary | ICD-10-CM | POA: Diagnosis not present

## 2022-11-10 DIAGNOSIS — I10 Essential (primary) hypertension: Secondary | ICD-10-CM | POA: Diagnosis not present

## 2022-11-10 DIAGNOSIS — G4700 Insomnia, unspecified: Secondary | ICD-10-CM | POA: Diagnosis not present

## 2022-11-12 DIAGNOSIS — F419 Anxiety disorder, unspecified: Secondary | ICD-10-CM | POA: Diagnosis not present

## 2022-11-12 DIAGNOSIS — E109 Type 1 diabetes mellitus without complications: Secondary | ICD-10-CM | POA: Diagnosis not present

## 2022-11-12 DIAGNOSIS — R531 Weakness: Secondary | ICD-10-CM | POA: Diagnosis not present

## 2022-11-12 DIAGNOSIS — I10 Essential (primary) hypertension: Secondary | ICD-10-CM | POA: Diagnosis not present

## 2022-11-12 DIAGNOSIS — R2689 Other abnormalities of gait and mobility: Secondary | ICD-10-CM | POA: Diagnosis not present

## 2022-11-12 DIAGNOSIS — Z9181 History of falling: Secondary | ICD-10-CM | POA: Diagnosis not present

## 2022-11-12 DIAGNOSIS — F32A Depression, unspecified: Secondary | ICD-10-CM | POA: Diagnosis not present

## 2022-11-12 DIAGNOSIS — G4700 Insomnia, unspecified: Secondary | ICD-10-CM | POA: Diagnosis not present

## 2022-11-12 DIAGNOSIS — G3184 Mild cognitive impairment, so stated: Secondary | ICD-10-CM | POA: Diagnosis not present

## 2022-11-12 DIAGNOSIS — R42 Dizziness and giddiness: Secondary | ICD-10-CM | POA: Diagnosis not present

## 2022-11-12 DIAGNOSIS — E785 Hyperlipidemia, unspecified: Secondary | ICD-10-CM | POA: Diagnosis not present

## 2022-11-16 DIAGNOSIS — H5203 Hypermetropia, bilateral: Secondary | ICD-10-CM | POA: Diagnosis not present

## 2022-11-17 DIAGNOSIS — E785 Hyperlipidemia, unspecified: Secondary | ICD-10-CM | POA: Diagnosis not present

## 2022-11-17 DIAGNOSIS — R2689 Other abnormalities of gait and mobility: Secondary | ICD-10-CM | POA: Diagnosis not present

## 2022-11-17 DIAGNOSIS — F419 Anxiety disorder, unspecified: Secondary | ICD-10-CM | POA: Diagnosis not present

## 2022-11-17 DIAGNOSIS — I10 Essential (primary) hypertension: Secondary | ICD-10-CM | POA: Diagnosis not present

## 2022-11-17 DIAGNOSIS — G4700 Insomnia, unspecified: Secondary | ICD-10-CM | POA: Diagnosis not present

## 2022-11-17 DIAGNOSIS — G3184 Mild cognitive impairment, so stated: Secondary | ICD-10-CM | POA: Diagnosis not present

## 2022-11-17 DIAGNOSIS — R531 Weakness: Secondary | ICD-10-CM | POA: Diagnosis not present

## 2022-11-17 DIAGNOSIS — R42 Dizziness and giddiness: Secondary | ICD-10-CM | POA: Diagnosis not present

## 2022-11-17 DIAGNOSIS — E109 Type 1 diabetes mellitus without complications: Secondary | ICD-10-CM | POA: Diagnosis not present

## 2022-11-17 DIAGNOSIS — Z9181 History of falling: Secondary | ICD-10-CM | POA: Diagnosis not present

## 2022-11-17 DIAGNOSIS — F32A Depression, unspecified: Secondary | ICD-10-CM | POA: Diagnosis not present

## 2022-11-18 ENCOUNTER — Encounter: Payer: Self-pay | Admitting: *Deleted

## 2022-11-23 DIAGNOSIS — F419 Anxiety disorder, unspecified: Secondary | ICD-10-CM | POA: Diagnosis not present

## 2022-11-23 DIAGNOSIS — E785 Hyperlipidemia, unspecified: Secondary | ICD-10-CM | POA: Diagnosis not present

## 2022-11-23 DIAGNOSIS — R531 Weakness: Secondary | ICD-10-CM | POA: Diagnosis not present

## 2022-11-23 DIAGNOSIS — G4700 Insomnia, unspecified: Secondary | ICD-10-CM | POA: Diagnosis not present

## 2022-11-23 DIAGNOSIS — Z9181 History of falling: Secondary | ICD-10-CM | POA: Diagnosis not present

## 2022-11-23 DIAGNOSIS — F32A Depression, unspecified: Secondary | ICD-10-CM | POA: Diagnosis not present

## 2022-11-23 DIAGNOSIS — E109 Type 1 diabetes mellitus without complications: Secondary | ICD-10-CM | POA: Diagnosis not present

## 2022-11-23 DIAGNOSIS — R42 Dizziness and giddiness: Secondary | ICD-10-CM | POA: Diagnosis not present

## 2022-11-23 DIAGNOSIS — G3184 Mild cognitive impairment, so stated: Secondary | ICD-10-CM | POA: Diagnosis not present

## 2022-11-23 DIAGNOSIS — R2689 Other abnormalities of gait and mobility: Secondary | ICD-10-CM | POA: Diagnosis not present

## 2022-11-23 DIAGNOSIS — I10 Essential (primary) hypertension: Secondary | ICD-10-CM | POA: Diagnosis not present

## 2022-11-24 DIAGNOSIS — R2689 Other abnormalities of gait and mobility: Secondary | ICD-10-CM | POA: Diagnosis not present

## 2022-11-24 DIAGNOSIS — G4700 Insomnia, unspecified: Secondary | ICD-10-CM | POA: Diagnosis not present

## 2022-11-24 DIAGNOSIS — G3184 Mild cognitive impairment, so stated: Secondary | ICD-10-CM | POA: Diagnosis not present

## 2022-11-24 DIAGNOSIS — I10 Essential (primary) hypertension: Secondary | ICD-10-CM | POA: Diagnosis not present

## 2022-11-24 DIAGNOSIS — R531 Weakness: Secondary | ICD-10-CM | POA: Diagnosis not present

## 2022-11-24 DIAGNOSIS — F32A Depression, unspecified: Secondary | ICD-10-CM | POA: Diagnosis not present

## 2022-11-24 DIAGNOSIS — R42 Dizziness and giddiness: Secondary | ICD-10-CM | POA: Diagnosis not present

## 2022-11-24 DIAGNOSIS — F419 Anxiety disorder, unspecified: Secondary | ICD-10-CM | POA: Diagnosis not present

## 2022-11-24 DIAGNOSIS — E109 Type 1 diabetes mellitus without complications: Secondary | ICD-10-CM | POA: Diagnosis not present

## 2022-11-24 DIAGNOSIS — Z9181 History of falling: Secondary | ICD-10-CM | POA: Diagnosis not present

## 2022-11-24 DIAGNOSIS — E785 Hyperlipidemia, unspecified: Secondary | ICD-10-CM | POA: Diagnosis not present

## 2022-11-25 ENCOUNTER — Telehealth: Payer: Self-pay | Admitting: Primary Care

## 2022-11-25 NOTE — Telephone Encounter (Signed)
If she had a true near/full syncopal episode then she needs evaluation.  Please provide ED precautions if this happens before we can see her in the office.

## 2022-11-25 NOTE — Telephone Encounter (Signed)
Brooke Morse from [facility] called stating she saw the pt yesterday, 2/21 for physical therapy & was told by pt's daughter that on 11/17/22, the pt fainted while eating. Tillie Rung stated she was told the pt didn't fall but was unresponsive for awhile so the pt's daughter laid the pt down until she was responsive again. Tillie Rung stated she was told the pt's vitals were fine & the pt nor daughter know what caused the fainting. Call back # [phone]

## 2022-11-26 MED ORDER — MEMANTINE HCL 5 MG PO TABS: ORAL_TABLET | ORAL | 0 refills | Status: DC

## 2022-11-26 NOTE — Telephone Encounter (Signed)
Reviewed patient's daughters note on MyChart.

## 2022-11-26 NOTE — Telephone Encounter (Signed)
Called and spoke to patients daughter, she stated she sent you a detailed attachment in a MyChart message. Scheduled appt. for 10/03/23. ED precautions provided.

## 2022-12-01 DIAGNOSIS — F419 Anxiety disorder, unspecified: Secondary | ICD-10-CM | POA: Diagnosis not present

## 2022-12-01 DIAGNOSIS — R531 Weakness: Secondary | ICD-10-CM | POA: Diagnosis not present

## 2022-12-01 DIAGNOSIS — E109 Type 1 diabetes mellitus without complications: Secondary | ICD-10-CM | POA: Diagnosis not present

## 2022-12-01 DIAGNOSIS — G4700 Insomnia, unspecified: Secondary | ICD-10-CM | POA: Diagnosis not present

## 2022-12-01 DIAGNOSIS — E785 Hyperlipidemia, unspecified: Secondary | ICD-10-CM | POA: Diagnosis not present

## 2022-12-01 DIAGNOSIS — F32A Depression, unspecified: Secondary | ICD-10-CM | POA: Diagnosis not present

## 2022-12-01 DIAGNOSIS — R42 Dizziness and giddiness: Secondary | ICD-10-CM | POA: Diagnosis not present

## 2022-12-01 DIAGNOSIS — Z9181 History of falling: Secondary | ICD-10-CM | POA: Diagnosis not present

## 2022-12-01 DIAGNOSIS — G3184 Mild cognitive impairment, so stated: Secondary | ICD-10-CM | POA: Diagnosis not present

## 2022-12-01 DIAGNOSIS — R2689 Other abnormalities of gait and mobility: Secondary | ICD-10-CM | POA: Diagnosis not present

## 2022-12-01 DIAGNOSIS — I10 Essential (primary) hypertension: Secondary | ICD-10-CM | POA: Diagnosis not present

## 2022-12-03 ENCOUNTER — Encounter: Payer: Self-pay | Admitting: Primary Care

## 2022-12-03 ENCOUNTER — Ambulatory Visit (INDEPENDENT_AMBULATORY_CARE_PROVIDER_SITE_OTHER): Payer: Medicare Other | Admitting: Primary Care

## 2022-12-03 VITALS — BP 130/82 | HR 57 | Temp 97.2°F | Ht 67.0 in | Wt 149.0 lb

## 2022-12-03 DIAGNOSIS — F419 Anxiety disorder, unspecified: Secondary | ICD-10-CM | POA: Diagnosis not present

## 2022-12-03 DIAGNOSIS — F32A Depression, unspecified: Secondary | ICD-10-CM

## 2022-12-03 DIAGNOSIS — R2689 Other abnormalities of gait and mobility: Secondary | ICD-10-CM | POA: Diagnosis not present

## 2022-12-03 DIAGNOSIS — R404 Transient alteration of awareness: Secondary | ICD-10-CM

## 2022-12-03 DIAGNOSIS — R6889 Other general symptoms and signs: Secondary | ICD-10-CM | POA: Diagnosis not present

## 2022-12-03 DIAGNOSIS — R5383 Other fatigue: Secondary | ICD-10-CM | POA: Diagnosis not present

## 2022-12-03 NOTE — Assessment & Plan Note (Signed)
Improving!  Continue home health exercises and activity.

## 2022-12-03 NOTE — Assessment & Plan Note (Addendum)
Unclear if etiology.  It is possible that she may have had a TIA, but no other symptoms at the time.  Fortunately, this has not occurred again. No alarm signs on exam and she appears very well today.   She will discuss this episode with neurology at her upcoming visit. Return precautions provided.

## 2022-12-03 NOTE — Assessment & Plan Note (Signed)
Improving!  Continue citalopram 10 mg daily.

## 2022-12-03 NOTE — Patient Instructions (Signed)
Follow-up with neurology as scheduled.  Continue your home exercises as discussed.  It was a pleasure to see you today!

## 2022-12-03 NOTE — Progress Notes (Signed)
Subjective:    Patient ID: Brooke Morse, female    DOB: 01/19/1946, 77 y.o.   MRN: [id number]  Loss of Consciousness Pertinent negatives include no dizziness.    SOPHIANA Morse is a very pleasant 77 y.o. female with a history of hypertension, type 1 diabetes, hyperlipidemia, imbalance, dizziness, memory changes who presents today to discuss unresponsiveness.   Her daughter joins Brooke Morse today who helps to provide information for HPI.  Her daughter sent Brooke Morse a message via [place] in late December 2023 regarding an unresponsive episode that was witnessed.  The patient was sitting at the breakfast table eating breakfast, suddenly felt nauseated and slumped over becoming unresponsive.  Her eyes were open but she could not connect her eyes with her daughter who is trying to get her attention.  The episode lasted approximately 2 minutes, nausea lasted for several hours, all symptoms resolved later that day. She denies syncope. She recalls most of that event.   She's not had an episode like this since December 2023. She suspects her episode in December 2023 was triggered by her neck pain.   Her daughter prepares most of her meals and ensures she is getting nutritious food. She is working with home health PT/OT and her balance and stamina have improved. She is working on Char[employer] fundraiser. She feels much better overall. She is using her rolator walker.    She has an appointment scheduled with the neurologist for next month. She continues to do well on citalopram 10 mg daily. She has yet to start the [place].    Review of Systems  Constitutional:  Negative for fatigue.  Cardiovascular:  Positive for syncope.  Musculoskeletal:  Negative for arthralgias.  Neurological:  Negative for dizziness and syncope.         Past Medical History:  Diagnosis Date   Diabetes mellitus without complication ([place])    Hyperlipemia     Social History   Socioeconomic History   Marital status: Divorced     Spouse name: Not on file   Number of children: Not on file   Years of education: Not on file   Highest education level: Not on file  Occupational History   Not on file  Tobacco Use   Smoking status: Never   Smokeless tobacco: Never  Vaping Use   Vaping Use: Never used  Substance and Sexual Activity   Alcohol use: No   Drug use: No   Sexual activity: Not on file  Other Topics Concern   Not on file  Social History Narrative   Divorced.   2 children, 5 grandchildren.   Retired. Once worked at a cigarette factory.   Enjoys sewing, cooking, walks 2 miles daily.    Social Determinants of Health   Financial Resource Strain: Low Risk  (04/08/2022)   Overall Financial Resource Strain (CARDIA)    Difficulty of Paying Living Expenses: Not hard at all  Food Insecurity: No Food Insecurity (04/08/2022)   Hunger Vital Sign    Worried About Running Out of Food in the Last Year: Never true    Ran Out of Food in the Last Year: Never true  Transportation Needs: No Transportation Needs (04/08/2022)   PRAPARE - Hydrologist (Medical): No    Lack of Transportation (Non-Medical): No  Physical Activity: Sufficiently Active (04/08/2022)   Exercise Vital Sign    Days of Exercise per Week: 5 days    Minutes of Exercise per Session: 30 min  Stress: No Stress Concern Present (04/08/2022)   [place]    Feeling of Stress : Not at all  Social Connections: Socially Isolated (04/08/2022)   Social Connection and Isolation Panel [NHANES]    Frequency of Communication with Friends and Family: More than three times a week    Frequency of Social Gatherings with Friends and Family: More than three times a week    Attends Religious Services: Never    Marine scientist or Organizations: No    Attends Archivist Meetings: Never    Marital Status: Divorced  Human resources officer Violence: Not At Risk (04/08/2022)    Humiliation, Afraid, Rape, and Kick questionnaire    Fear of Current or Ex-Partner: No    Emotionally Abused: No    Physically Abused: No    Sexually Abused: No    Past Surgical History:  Procedure Laterality Date   bunion removal     COLONOSCOPY WITH PROPOFOL N/A 03/18/2015   Procedure: COLONOSCOPY WITH PROPOFOL;  Surgeon: Hulen Luster, MD;  Location: [facility] ENDOSCOPY;  Service: Gastroenterology;  Laterality: N/A;    Family History  Problem Relation Age of Onset   Colon cancer Father    Asthma Father     Allergies  Allergen Reactions   Peanut-Containing Drug Products Itching    Current Outpatient Medications on File Prior to Visit  Medication Sig Dispense Refill   ascorbic acid (VITAMIN C) 1000 MG tablet Take by mouth.     aspirin 81 MG tablet Take 81 mg by mouth. About 2 times a week     atorvastatin (LIPITOR) 20 MG tablet Take 20 mg by mouth. 2 a week     citalopram (CELEXA) 20 MG tablet Take 1 tablet (20 mg total) by mouth daily. for anxiety and depression. 90 tablet 0   Fiber, Guar Gum, CHEW Chew 1 tablet by mouth daily.     insulin lispro (HUMALOG) 100 UNIT/ML KiwkPen Inject 12 Units as directed 3 (three) times daily.     Insulin Pen Needle (FIFTY50 PEN NEEDLES) 32G X 4 MM MISC USE FOUR TIMES A DAY AS DIRECTED.     Lancets (ONETOUCH DELICA PLUS [id number]) MISC USE TO TEST BLOOD SUGAR FOUR TIMES DAILY AS INSTRUCTED     memantine (NAMENDA) 5 MG tablet Take 1 tablet by mouth once daily for 1 week, then increase to 1 tablet by mouth twice daily thereafter for memory. 180 tablet 0   METAMUCIL FIBER PO Take by mouth.     Multiple Vitamin (MULTIVITAMIN WITH MINERALS) TABS tablet Take 1 tablet by mouth daily.     Omega-3 Fatty Acids (OMEGA-3 1450 PO) Take by mouth.     ONETOUCH ULTRA test strip USE 1 STRIP TO CHECK GLUCOSE 4 TIMES DAILY     No current facility-administered medications on file prior to visit.    BP 130/82   Pulse (!) 57   Temp (!) 97.2 F (36.2 C) (Temporal)    Ht '5\' 7"'$  (1.702 m)   Wt 149 lb (67.6 kg)   SpO2 100%   BMI 23.34 kg/m  Objective:   Physical Exam Cardiovascular:     Rate and Rhythm: Normal rate and regular rhythm.  Pulmonary:     Effort: Pulmonary effort is normal.     Breath sounds: Normal breath sounds.  Musculoskeletal:     Cervical back: Neck supple.  Skin:    General: Skin is warm and dry.  Assessment & Plan:  Anxiety and depression Assessment & Plan: Improving!  Continue citalopram 10 mg daily.   Other fatigue Assessment & Plan: Improved!  Continue daily exercise and activity.   Forgetfulness Assessment & Plan: Follow up with neurology as scheduled.   Start Namenda 5 mg when ready. Instructions provided for initiation.   Imbalance Assessment & Plan: Improving!  Continue home health exercises and activity.   Episode of unresponsiveness Assessment & Plan: Unclear if etiology.  It is possible that she may have had a TIA, but no other symptoms at the time.  Fortunately, this has not occurred again. No alarm signs on exam and she appears very well today.   She will discuss this episode with neurology at her upcoming visit. Return precautions provided.          Pleas Koch, NP

## 2022-12-03 NOTE — Assessment & Plan Note (Signed)
Improved!  Continue daily exercise and activity.

## 2022-12-03 NOTE — Assessment & Plan Note (Signed)
Follow up with neurology as scheduled.   Start Namenda 5 mg when ready. Instructions provided for initiation.

## 2022-12-04 DIAGNOSIS — K080 Exfoliation of teeth due to systemic causes: Secondary | ICD-10-CM | POA: Diagnosis not present

## 2022-12-06 MED ORDER — MEMANTINE HCL 5 MG PO TABS: ORAL_TABLET | ORAL | 0 refills | Status: DC

## 2022-12-08 DIAGNOSIS — Z9181 History of falling: Secondary | ICD-10-CM | POA: Diagnosis not present

## 2022-12-08 DIAGNOSIS — R42 Dizziness and giddiness: Secondary | ICD-10-CM | POA: Diagnosis not present

## 2022-12-08 DIAGNOSIS — R2689 Other abnormalities of gait and mobility: Secondary | ICD-10-CM | POA: Diagnosis not present

## 2022-12-08 DIAGNOSIS — G3184 Mild cognitive impairment, so stated: Secondary | ICD-10-CM | POA: Diagnosis not present

## 2022-12-08 DIAGNOSIS — G4700 Insomnia, unspecified: Secondary | ICD-10-CM | POA: Diagnosis not present

## 2022-12-08 DIAGNOSIS — E109 Type 1 diabetes mellitus without complications: Secondary | ICD-10-CM | POA: Diagnosis not present

## 2022-12-08 DIAGNOSIS — E785 Hyperlipidemia, unspecified: Secondary | ICD-10-CM | POA: Diagnosis not present

## 2022-12-08 DIAGNOSIS — F32A Depression, unspecified: Secondary | ICD-10-CM | POA: Diagnosis not present

## 2022-12-08 DIAGNOSIS — I10 Essential (primary) hypertension: Secondary | ICD-10-CM | POA: Diagnosis not present

## 2022-12-08 DIAGNOSIS — F419 Anxiety disorder, unspecified: Secondary | ICD-10-CM | POA: Diagnosis not present

## 2022-12-08 DIAGNOSIS — R531 Weakness: Secondary | ICD-10-CM | POA: Diagnosis not present

## 2022-12-09 ENCOUNTER — Telehealth: Payer: Self-pay | Admitting: Primary Care

## 2022-12-09 DIAGNOSIS — F419 Anxiety disorder, unspecified: Secondary | ICD-10-CM | POA: Diagnosis not present

## 2022-12-09 DIAGNOSIS — E109 Type 1 diabetes mellitus without complications: Secondary | ICD-10-CM | POA: Diagnosis not present

## 2022-12-09 DIAGNOSIS — R531 Weakness: Secondary | ICD-10-CM | POA: Diagnosis not present

## 2022-12-09 DIAGNOSIS — F32A Depression, unspecified: Secondary | ICD-10-CM | POA: Diagnosis not present

## 2022-12-09 DIAGNOSIS — E785 Hyperlipidemia, unspecified: Secondary | ICD-10-CM | POA: Diagnosis not present

## 2022-12-09 DIAGNOSIS — G4700 Insomnia, unspecified: Secondary | ICD-10-CM | POA: Diagnosis not present

## 2022-12-09 DIAGNOSIS — I10 Essential (primary) hypertension: Secondary | ICD-10-CM | POA: Diagnosis not present

## 2022-12-09 DIAGNOSIS — G3184 Mild cognitive impairment, so stated: Secondary | ICD-10-CM | POA: Diagnosis not present

## 2022-12-09 DIAGNOSIS — R2689 Other abnormalities of gait and mobility: Secondary | ICD-10-CM | POA: Diagnosis not present

## 2022-12-09 DIAGNOSIS — R42 Dizziness and giddiness: Secondary | ICD-10-CM | POA: Diagnosis not present

## 2022-12-09 DIAGNOSIS — Z9181 History of falling: Secondary | ICD-10-CM | POA: Diagnosis not present

## 2022-12-09 NOTE — Telephone Encounter (Signed)
Home Health verbal orders Caller Name: [place] Name: [facility]  Callback number: [phone]  Requesting OT  Reason: recertification  Please forward to [facility] pool or providers CMA

## 2022-12-10 NOTE — Telephone Encounter (Signed)
Approved.  

## 2022-12-11 NOTE — Telephone Encounter (Signed)
Attempted to call Colletta [place] back, call could not be completed

## 2022-12-11 NOTE — Telephone Encounter (Signed)
Called and advised Colletta [place] with [facility] of the approval of the requested verbal orders for this patient. Advised to call back with any further questions.    [phone]

## 2022-12-14 DIAGNOSIS — R531 Weakness: Secondary | ICD-10-CM | POA: Diagnosis not present

## 2022-12-14 DIAGNOSIS — F419 Anxiety disorder, unspecified: Secondary | ICD-10-CM | POA: Diagnosis not present

## 2022-12-14 DIAGNOSIS — R42 Dizziness and giddiness: Secondary | ICD-10-CM | POA: Diagnosis not present

## 2022-12-14 DIAGNOSIS — I10 Essential (primary) hypertension: Secondary | ICD-10-CM | POA: Diagnosis not present

## 2022-12-14 DIAGNOSIS — Z9181 History of falling: Secondary | ICD-10-CM | POA: Diagnosis not present

## 2022-12-14 DIAGNOSIS — G3184 Mild cognitive impairment, so stated: Secondary | ICD-10-CM | POA: Diagnosis not present

## 2022-12-14 DIAGNOSIS — E785 Hyperlipidemia, unspecified: Secondary | ICD-10-CM | POA: Diagnosis not present

## 2022-12-14 DIAGNOSIS — F32A Depression, unspecified: Secondary | ICD-10-CM | POA: Diagnosis not present

## 2022-12-14 DIAGNOSIS — R2689 Other abnormalities of gait and mobility: Secondary | ICD-10-CM | POA: Diagnosis not present

## 2022-12-14 DIAGNOSIS — G4700 Insomnia, unspecified: Secondary | ICD-10-CM | POA: Diagnosis not present

## 2022-12-14 DIAGNOSIS — E109 Type 1 diabetes mellitus without complications: Secondary | ICD-10-CM | POA: Diagnosis not present

## 2022-12-17 ENCOUNTER — Telehealth: Payer: Self-pay | Admitting: Primary Care

## 2022-12-17 DIAGNOSIS — R42 Dizziness and giddiness: Secondary | ICD-10-CM | POA: Diagnosis not present

## 2022-12-17 DIAGNOSIS — F32A Depression, unspecified: Secondary | ICD-10-CM | POA: Diagnosis not present

## 2022-12-17 DIAGNOSIS — I10 Essential (primary) hypertension: Secondary | ICD-10-CM | POA: Diagnosis not present

## 2022-12-17 DIAGNOSIS — Z9181 History of falling: Secondary | ICD-10-CM | POA: Diagnosis not present

## 2022-12-17 DIAGNOSIS — G3184 Mild cognitive impairment, so stated: Secondary | ICD-10-CM | POA: Diagnosis not present

## 2022-12-17 DIAGNOSIS — R531 Weakness: Secondary | ICD-10-CM | POA: Diagnosis not present

## 2022-12-17 DIAGNOSIS — R2689 Other abnormalities of gait and mobility: Secondary | ICD-10-CM | POA: Diagnosis not present

## 2022-12-17 DIAGNOSIS — E109 Type 1 diabetes mellitus without complications: Secondary | ICD-10-CM | POA: Diagnosis not present

## 2022-12-17 DIAGNOSIS — G4700 Insomnia, unspecified: Secondary | ICD-10-CM | POA: Diagnosis not present

## 2022-12-17 DIAGNOSIS — E785 Hyperlipidemia, unspecified: Secondary | ICD-10-CM | POA: Diagnosis not present

## 2022-12-17 DIAGNOSIS — F419 Anxiety disorder, unspecified: Secondary | ICD-10-CM | POA: Diagnosis not present

## 2022-12-17 NOTE — Telephone Encounter (Signed)
Approved.  

## 2022-12-17 NOTE — Telephone Encounter (Signed)
Home Health verbal orders Caller Name: [place] Name: [facility] number: [phone]  Requesting OT  Frequency: 1x a week for 4 weeks  Please forward to [facility] pool or providers CMA

## 2022-12-18 NOTE — Telephone Encounter (Signed)
Called and advised Colletta [place] with [facility] of the approval of the requested verbal orders for this patient. Advised to call back with any further questions.

## 2022-12-22 DIAGNOSIS — R29818 Other symptoms and signs involving the nervous system: Secondary | ICD-10-CM | POA: Diagnosis not present

## 2022-12-22 DIAGNOSIS — R531 Weakness: Secondary | ICD-10-CM | POA: Diagnosis not present

## 2022-12-22 DIAGNOSIS — R413 Other amnesia: Secondary | ICD-10-CM | POA: Diagnosis not present

## 2022-12-23 DIAGNOSIS — E109 Type 1 diabetes mellitus without complications: Secondary | ICD-10-CM | POA: Diagnosis not present

## 2022-12-23 DIAGNOSIS — Z9181 History of falling: Secondary | ICD-10-CM | POA: Diagnosis not present

## 2022-12-23 DIAGNOSIS — G3184 Mild cognitive impairment, so stated: Secondary | ICD-10-CM | POA: Diagnosis not present

## 2022-12-23 DIAGNOSIS — R42 Dizziness and giddiness: Secondary | ICD-10-CM | POA: Diagnosis not present

## 2022-12-23 DIAGNOSIS — G4700 Insomnia, unspecified: Secondary | ICD-10-CM | POA: Diagnosis not present

## 2022-12-23 DIAGNOSIS — I10 Essential (primary) hypertension: Secondary | ICD-10-CM | POA: Diagnosis not present

## 2022-12-23 DIAGNOSIS — R2689 Other abnormalities of gait and mobility: Secondary | ICD-10-CM | POA: Diagnosis not present

## 2022-12-23 DIAGNOSIS — R531 Weakness: Secondary | ICD-10-CM | POA: Diagnosis not present

## 2022-12-23 DIAGNOSIS — F32A Depression, unspecified: Secondary | ICD-10-CM | POA: Diagnosis not present

## 2022-12-23 DIAGNOSIS — E7800 Pure hypercholesterolemia, unspecified: Secondary | ICD-10-CM | POA: Diagnosis not present

## 2022-12-23 DIAGNOSIS — F419 Anxiety disorder, unspecified: Secondary | ICD-10-CM | POA: Diagnosis not present

## 2022-12-24 DIAGNOSIS — Z9181 History of falling: Secondary | ICD-10-CM | POA: Diagnosis not present

## 2022-12-24 DIAGNOSIS — R531 Weakness: Secondary | ICD-10-CM | POA: Diagnosis not present

## 2022-12-24 DIAGNOSIS — R42 Dizziness and giddiness: Secondary | ICD-10-CM | POA: Diagnosis not present

## 2022-12-24 DIAGNOSIS — G3184 Mild cognitive impairment, so stated: Secondary | ICD-10-CM | POA: Diagnosis not present

## 2022-12-24 DIAGNOSIS — F419 Anxiety disorder, unspecified: Secondary | ICD-10-CM | POA: Diagnosis not present

## 2022-12-24 DIAGNOSIS — F32A Depression, unspecified: Secondary | ICD-10-CM | POA: Diagnosis not present

## 2022-12-24 DIAGNOSIS — G4700 Insomnia, unspecified: Secondary | ICD-10-CM | POA: Diagnosis not present

## 2022-12-24 DIAGNOSIS — E7800 Pure hypercholesterolemia, unspecified: Secondary | ICD-10-CM | POA: Diagnosis not present

## 2022-12-24 DIAGNOSIS — I10 Essential (primary) hypertension: Secondary | ICD-10-CM | POA: Diagnosis not present

## 2022-12-24 DIAGNOSIS — R2689 Other abnormalities of gait and mobility: Secondary | ICD-10-CM | POA: Diagnosis not present

## 2022-12-24 DIAGNOSIS — E109 Type 1 diabetes mellitus without complications: Secondary | ICD-10-CM | POA: Diagnosis not present

## 2022-12-30 DIAGNOSIS — F419 Anxiety disorder, unspecified: Secondary | ICD-10-CM | POA: Diagnosis not present

## 2022-12-30 DIAGNOSIS — F32A Depression, unspecified: Secondary | ICD-10-CM | POA: Diagnosis not present

## 2022-12-30 DIAGNOSIS — R2689 Other abnormalities of gait and mobility: Secondary | ICD-10-CM | POA: Diagnosis not present

## 2022-12-30 DIAGNOSIS — R531 Weakness: Secondary | ICD-10-CM | POA: Diagnosis not present

## 2022-12-30 DIAGNOSIS — Z9181 History of falling: Secondary | ICD-10-CM | POA: Diagnosis not present

## 2022-12-30 DIAGNOSIS — R42 Dizziness and giddiness: Secondary | ICD-10-CM | POA: Diagnosis not present

## 2022-12-30 DIAGNOSIS — E109 Type 1 diabetes mellitus without complications: Secondary | ICD-10-CM | POA: Diagnosis not present

## 2022-12-30 DIAGNOSIS — E7800 Pure hypercholesterolemia, unspecified: Secondary | ICD-10-CM | POA: Diagnosis not present

## 2022-12-30 DIAGNOSIS — I10 Essential (primary) hypertension: Secondary | ICD-10-CM | POA: Diagnosis not present

## 2022-12-30 DIAGNOSIS — G4700 Insomnia, unspecified: Secondary | ICD-10-CM | POA: Diagnosis not present

## 2022-12-30 DIAGNOSIS — G3184 Mild cognitive impairment, so stated: Secondary | ICD-10-CM | POA: Diagnosis not present

## 2023-01-01 DIAGNOSIS — Z9181 History of falling: Secondary | ICD-10-CM | POA: Diagnosis not present

## 2023-01-01 DIAGNOSIS — G4700 Insomnia, unspecified: Secondary | ICD-10-CM | POA: Diagnosis not present

## 2023-01-01 DIAGNOSIS — E7800 Pure hypercholesterolemia, unspecified: Secondary | ICD-10-CM | POA: Diagnosis not present

## 2023-01-01 DIAGNOSIS — R531 Weakness: Secondary | ICD-10-CM | POA: Diagnosis not present

## 2023-01-01 DIAGNOSIS — I10 Essential (primary) hypertension: Secondary | ICD-10-CM | POA: Diagnosis not present

## 2023-01-01 DIAGNOSIS — R2689 Other abnormalities of gait and mobility: Secondary | ICD-10-CM | POA: Diagnosis not present

## 2023-01-01 DIAGNOSIS — E109 Type 1 diabetes mellitus without complications: Secondary | ICD-10-CM | POA: Diagnosis not present

## 2023-01-01 DIAGNOSIS — F32A Depression, unspecified: Secondary | ICD-10-CM | POA: Diagnosis not present

## 2023-01-01 DIAGNOSIS — F419 Anxiety disorder, unspecified: Secondary | ICD-10-CM | POA: Diagnosis not present

## 2023-01-01 DIAGNOSIS — R42 Dizziness and giddiness: Secondary | ICD-10-CM | POA: Diagnosis not present

## 2023-01-01 DIAGNOSIS — G3184 Mild cognitive impairment, so stated: Secondary | ICD-10-CM | POA: Diagnosis not present

## 2023-01-06 DIAGNOSIS — E7800 Pure hypercholesterolemia, unspecified: Secondary | ICD-10-CM | POA: Diagnosis not present

## 2023-01-06 DIAGNOSIS — R42 Dizziness and giddiness: Secondary | ICD-10-CM | POA: Diagnosis not present

## 2023-01-06 DIAGNOSIS — I10 Essential (primary) hypertension: Secondary | ICD-10-CM | POA: Diagnosis not present

## 2023-01-06 DIAGNOSIS — F419 Anxiety disorder, unspecified: Secondary | ICD-10-CM | POA: Diagnosis not present

## 2023-01-06 DIAGNOSIS — R2689 Other abnormalities of gait and mobility: Secondary | ICD-10-CM | POA: Diagnosis not present

## 2023-01-06 DIAGNOSIS — E109 Type 1 diabetes mellitus without complications: Secondary | ICD-10-CM | POA: Diagnosis not present

## 2023-01-06 DIAGNOSIS — G4700 Insomnia, unspecified: Secondary | ICD-10-CM | POA: Diagnosis not present

## 2023-01-06 DIAGNOSIS — G3184 Mild cognitive impairment, so stated: Secondary | ICD-10-CM | POA: Diagnosis not present

## 2023-01-06 DIAGNOSIS — R531 Weakness: Secondary | ICD-10-CM | POA: Diagnosis not present

## 2023-01-06 DIAGNOSIS — Z9181 History of falling: Secondary | ICD-10-CM | POA: Diagnosis not present

## 2023-01-06 DIAGNOSIS — F32A Depression, unspecified: Secondary | ICD-10-CM | POA: Diagnosis not present

## 2023-01-07 DIAGNOSIS — R2689 Other abnormalities of gait and mobility: Secondary | ICD-10-CM | POA: Diagnosis not present

## 2023-01-07 DIAGNOSIS — G3184 Mild cognitive impairment, so stated: Secondary | ICD-10-CM | POA: Diagnosis not present

## 2023-01-07 DIAGNOSIS — I10 Essential (primary) hypertension: Secondary | ICD-10-CM | POA: Diagnosis not present

## 2023-01-07 DIAGNOSIS — R531 Weakness: Secondary | ICD-10-CM | POA: Diagnosis not present

## 2023-01-07 DIAGNOSIS — F32A Depression, unspecified: Secondary | ICD-10-CM | POA: Diagnosis not present

## 2023-01-07 DIAGNOSIS — F419 Anxiety disorder, unspecified: Secondary | ICD-10-CM | POA: Diagnosis not present

## 2023-01-07 DIAGNOSIS — E7800 Pure hypercholesterolemia, unspecified: Secondary | ICD-10-CM | POA: Diagnosis not present

## 2023-01-07 DIAGNOSIS — R42 Dizziness and giddiness: Secondary | ICD-10-CM | POA: Diagnosis not present

## 2023-01-07 DIAGNOSIS — G4700 Insomnia, unspecified: Secondary | ICD-10-CM | POA: Diagnosis not present

## 2023-01-07 DIAGNOSIS — E109 Type 1 diabetes mellitus without complications: Secondary | ICD-10-CM | POA: Diagnosis not present

## 2023-01-07 DIAGNOSIS — Z9181 History of falling: Secondary | ICD-10-CM | POA: Diagnosis not present

## 2023-01-11 ENCOUNTER — Telehealth: Payer: Self-pay | Admitting: Primary Care

## 2023-01-11 NOTE — Telephone Encounter (Signed)
Stephanie from [facility] called in to report that patient would like to move her OT from this week to next week.

## 2023-01-11 NOTE — Telephone Encounter (Signed)
Noted and I'm okay with that. Whatever works with her schedule.

## 2023-01-11 NOTE — Telephone Encounter (Signed)
Called and advised Judeth Cornfield with [facility] that Brooke Morse is [place] with OT being moved to next week. Advised to call back with any further questions.

## 2023-01-19 DIAGNOSIS — R531 Weakness: Secondary | ICD-10-CM | POA: Diagnosis not present

## 2023-01-19 DIAGNOSIS — G4700 Insomnia, unspecified: Secondary | ICD-10-CM | POA: Diagnosis not present

## 2023-01-19 DIAGNOSIS — F32A Depression, unspecified: Secondary | ICD-10-CM | POA: Diagnosis not present

## 2023-01-19 DIAGNOSIS — R42 Dizziness and giddiness: Secondary | ICD-10-CM | POA: Diagnosis not present

## 2023-01-19 DIAGNOSIS — F419 Anxiety disorder, unspecified: Secondary | ICD-10-CM | POA: Diagnosis not present

## 2023-01-19 DIAGNOSIS — Z9181 History of falling: Secondary | ICD-10-CM | POA: Diagnosis not present

## 2023-01-19 DIAGNOSIS — R2689 Other abnormalities of gait and mobility: Secondary | ICD-10-CM | POA: Diagnosis not present

## 2023-01-19 DIAGNOSIS — G3184 Mild cognitive impairment, so stated: Secondary | ICD-10-CM | POA: Diagnosis not present

## 2023-01-19 DIAGNOSIS — E7800 Pure hypercholesterolemia, unspecified: Secondary | ICD-10-CM | POA: Diagnosis not present

## 2023-01-19 DIAGNOSIS — I10 Essential (primary) hypertension: Secondary | ICD-10-CM | POA: Diagnosis not present

## 2023-01-19 DIAGNOSIS — E109 Type 1 diabetes mellitus without complications: Secondary | ICD-10-CM | POA: Diagnosis not present

## 2023-01-27 DIAGNOSIS — F32A Depression, unspecified: Secondary | ICD-10-CM | POA: Diagnosis not present

## 2023-01-27 DIAGNOSIS — F419 Anxiety disorder, unspecified: Secondary | ICD-10-CM | POA: Diagnosis not present

## 2023-01-27 DIAGNOSIS — G3184 Mild cognitive impairment, so stated: Secondary | ICD-10-CM | POA: Diagnosis not present

## 2023-01-27 DIAGNOSIS — G4700 Insomnia, unspecified: Secondary | ICD-10-CM | POA: Diagnosis not present

## 2023-01-27 DIAGNOSIS — E7800 Pure hypercholesterolemia, unspecified: Secondary | ICD-10-CM | POA: Diagnosis not present

## 2023-01-27 DIAGNOSIS — E109 Type 1 diabetes mellitus without complications: Secondary | ICD-10-CM | POA: Diagnosis not present

## 2023-01-27 DIAGNOSIS — R531 Weakness: Secondary | ICD-10-CM | POA: Diagnosis not present

## 2023-01-27 DIAGNOSIS — I10 Essential (primary) hypertension: Secondary | ICD-10-CM | POA: Diagnosis not present

## 2023-01-27 DIAGNOSIS — R2689 Other abnormalities of gait and mobility: Secondary | ICD-10-CM | POA: Diagnosis not present

## 2023-01-27 DIAGNOSIS — R42 Dizziness and giddiness: Secondary | ICD-10-CM | POA: Diagnosis not present

## 2023-01-27 DIAGNOSIS — Z9181 History of falling: Secondary | ICD-10-CM | POA: Diagnosis not present

## 2023-01-28 DIAGNOSIS — E7800 Pure hypercholesterolemia, unspecified: Secondary | ICD-10-CM | POA: Diagnosis not present

## 2023-01-28 DIAGNOSIS — R531 Weakness: Secondary | ICD-10-CM | POA: Diagnosis not present

## 2023-01-28 DIAGNOSIS — F32A Depression, unspecified: Secondary | ICD-10-CM | POA: Diagnosis not present

## 2023-01-28 DIAGNOSIS — F419 Anxiety disorder, unspecified: Secondary | ICD-10-CM | POA: Diagnosis not present

## 2023-01-28 DIAGNOSIS — R42 Dizziness and giddiness: Secondary | ICD-10-CM | POA: Diagnosis not present

## 2023-01-28 DIAGNOSIS — G3184 Mild cognitive impairment, so stated: Secondary | ICD-10-CM | POA: Diagnosis not present

## 2023-01-28 DIAGNOSIS — Z9181 History of falling: Secondary | ICD-10-CM | POA: Diagnosis not present

## 2023-01-28 DIAGNOSIS — G4700 Insomnia, unspecified: Secondary | ICD-10-CM | POA: Diagnosis not present

## 2023-01-28 DIAGNOSIS — R2689 Other abnormalities of gait and mobility: Secondary | ICD-10-CM | POA: Diagnosis not present

## 2023-01-28 DIAGNOSIS — I10 Essential (primary) hypertension: Secondary | ICD-10-CM | POA: Diagnosis not present

## 2023-01-28 DIAGNOSIS — E109 Type 1 diabetes mellitus without complications: Secondary | ICD-10-CM | POA: Diagnosis not present

## 2023-02-05 DIAGNOSIS — R531 Weakness: Secondary | ICD-10-CM | POA: Diagnosis not present

## 2023-02-05 DIAGNOSIS — G3184 Mild cognitive impairment, so stated: Secondary | ICD-10-CM | POA: Diagnosis not present

## 2023-02-05 DIAGNOSIS — Z9181 History of falling: Secondary | ICD-10-CM | POA: Diagnosis not present

## 2023-02-05 DIAGNOSIS — E7800 Pure hypercholesterolemia, unspecified: Secondary | ICD-10-CM | POA: Diagnosis not present

## 2023-02-05 DIAGNOSIS — F419 Anxiety disorder, unspecified: Secondary | ICD-10-CM | POA: Diagnosis not present

## 2023-02-05 DIAGNOSIS — R2689 Other abnormalities of gait and mobility: Secondary | ICD-10-CM | POA: Diagnosis not present

## 2023-02-05 DIAGNOSIS — G4700 Insomnia, unspecified: Secondary | ICD-10-CM | POA: Diagnosis not present

## 2023-02-05 DIAGNOSIS — R42 Dizziness and giddiness: Secondary | ICD-10-CM | POA: Diagnosis not present

## 2023-02-05 DIAGNOSIS — E109 Type 1 diabetes mellitus without complications: Secondary | ICD-10-CM | POA: Diagnosis not present

## 2023-02-05 DIAGNOSIS — F32A Depression, unspecified: Secondary | ICD-10-CM | POA: Diagnosis not present

## 2023-02-05 DIAGNOSIS — I10 Essential (primary) hypertension: Secondary | ICD-10-CM | POA: Diagnosis not present

## 2023-02-10 DIAGNOSIS — E109 Type 1 diabetes mellitus without complications: Secondary | ICD-10-CM | POA: Diagnosis not present

## 2023-02-10 DIAGNOSIS — R531 Weakness: Secondary | ICD-10-CM | POA: Diagnosis not present

## 2023-02-10 DIAGNOSIS — G4700 Insomnia, unspecified: Secondary | ICD-10-CM | POA: Diagnosis not present

## 2023-02-10 DIAGNOSIS — Z9181 History of falling: Secondary | ICD-10-CM | POA: Diagnosis not present

## 2023-02-10 DIAGNOSIS — F419 Anxiety disorder, unspecified: Secondary | ICD-10-CM | POA: Diagnosis not present

## 2023-02-10 DIAGNOSIS — R2689 Other abnormalities of gait and mobility: Secondary | ICD-10-CM | POA: Diagnosis not present

## 2023-02-10 DIAGNOSIS — I10 Essential (primary) hypertension: Secondary | ICD-10-CM | POA: Diagnosis not present

## 2023-02-10 DIAGNOSIS — F32A Depression, unspecified: Secondary | ICD-10-CM | POA: Diagnosis not present

## 2023-02-10 DIAGNOSIS — E7800 Pure hypercholesterolemia, unspecified: Secondary | ICD-10-CM | POA: Diagnosis not present

## 2023-02-10 DIAGNOSIS — G3184 Mild cognitive impairment, so stated: Secondary | ICD-10-CM | POA: Diagnosis not present

## 2023-02-10 DIAGNOSIS — R42 Dizziness and giddiness: Secondary | ICD-10-CM | POA: Diagnosis not present

## 2023-02-11 DIAGNOSIS — R413 Other amnesia: Secondary | ICD-10-CM | POA: Diagnosis not present

## 2023-02-11 DIAGNOSIS — R531 Weakness: Secondary | ICD-10-CM | POA: Diagnosis not present

## 2023-02-11 DIAGNOSIS — R29818 Other symptoms and signs involving the nervous system: Secondary | ICD-10-CM | POA: Diagnosis not present

## 2023-03-04 ENCOUNTER — Other Ambulatory Visit: Payer: Self-pay | Admitting: Primary Care

## 2023-03-04 DIAGNOSIS — R6889 Other general symptoms and signs: Secondary | ICD-10-CM

## 2023-03-05 DIAGNOSIS — K080 Exfoliation of teeth due to systemic causes: Secondary | ICD-10-CM | POA: Diagnosis not present

## 2023-04-12 ENCOUNTER — Ambulatory Visit (INDEPENDENT_AMBULATORY_CARE_PROVIDER_SITE_OTHER): Payer: Medicare Other

## 2023-04-12 VITALS — Ht 66.5 in | Wt 144.0 lb

## 2023-04-12 DIAGNOSIS — Z0000 Encounter for general adult medical examination without abnormal findings: Secondary | ICD-10-CM | POA: Diagnosis not present

## 2023-04-12 DIAGNOSIS — Z1231 Encounter for screening mammogram for malignant neoplasm of breast: Secondary | ICD-10-CM

## 2023-04-12 NOTE — Patient Instructions (Signed)
[name] , Thank you for taking time to come for your Medicare Wellness Visit. I appreciate your ongoing commitment to your health goals. Please review the following plan we discussed and let me know if I can assist you in the future.   These are the goals we discussed:  Goals       Increase physical activity      Starting 01/20/2018, I will continue to walk at least 2 miles daily.       Increase physical activity (pt-stated)      Patient Stated      Take medications as prescribed and  increase activity.        This is a list of the screening recommended for you and due dates:  Health Maintenance  Topic Date Due   Yearly kidney health urinalysis for diabetes  Never done   Zoster (Shingles) Vaccine (1 of 2) Never done   Pneumonia Vaccine (2 of 2 - PCV) 01/07/2016   Hemoglobin A1C  01/08/2022   Complete foot exam   07/10/2022   Yearly kidney function blood test for diabetes  03/06/2023   Medicare Annual Wellness Visit  04/09/2023   Eye exam for diabetics  02/04/2024*   Flu Shot  05/06/2023   Colon Cancer Screening  03/17/2025   DEXA scan (bone density measurement)  Completed   Hepatitis C Screening  Completed   HPV Vaccine  Aged Out   DTaP/Tdap/Td vaccine  Discontinued   COVID-19 Vaccine  Discontinued  *Topic was postponed. The date shown is not the original due date.   You have an order for:  []   2D Mammogram  [x]   3D Mammogram  []   Bone Density     Please call for appointment:  [facility]  [address] [place] [address] [phone]  [facility] [address] [place], [place] [address] [phone]  [facility] [address] Geanie Logan [place], [place] [address] [phone]    Make sure to wear two-piece clothing.  No lotions, powders, or deodorants the day of the appointment. Make sure to bring picture ID and insurance card.   Bring list of medications you are currently taking including any supplements.   Schedule your [place] screening mammogram through MyChart!   Log into your MyChart account.  Go to 'Visit' (or 'Appointments' if on mobile App) --> Schedule an Appointment  Under 'Select a Reason for Visit' choose the Mammogram Screening option.  Complete the pre-visit questions and select the time and place that best fits your schedule.    Advanced directives: none  Conditions/risks identified: Aim for 30 minutes of exercise or brisk walking, 6-8 glasses of water, and 5 servings of fruits and vegetables each day.   Next appointment: Follow up in one year for your annual wellness visit 04/12/24 @ 1pm telephone call   Preventive Care 65 Years and Older, Female Preventive care refers to lifestyle choices and visits with your health care provider that can promote health and wellness. What does preventive care include? A yearly physical exam. This is also called an annual well check. Dental exams once or twice a year. Routine eye exams. Ask your health care provider how often you should have your eyes checked. Personal lifestyle choices, including: Daily care of your teeth and gums. Regular physical activity. Eating a healthy diet. Avoiding tobacco and drug use. Limiting alcohol use. Practicing safe sex.  Taking low-dose aspirin every day. Taking vitamin and mineral supplements as recommended by your health care provider. What happens during an annual well check? The services and screenings done by your health care provider during your annual well check will depend on your age, overall health, lifestyle risk factors, and family history of disease. Counseling  Your health care provider may ask you questions about your: Alcohol use. Tobacco use. Drug use. Emotional well-being. Home and relationship well-being. Sexual activity. Eating habits. History of falls. Memory and ability to understand  (cognition). Work and work Astronomer. Reproductive health. Screening  You may have the following tests or measurements: Height, weight, and BMI. Blood pressure. Lipid and cholesterol levels. These may be checked every 5 years, or more frequently if you are over 66 years old. Skin check. Lung cancer screening. You may have this screening every year starting at age 65 if you have a 30-pack-year history of smoking and currently smoke or have quit within the past 15 years. Fecal occult blood test (FOBT) of the stool. You may have this test every year starting at age 16. Flexible sigmoidoscopy or colonoscopy. You may have a sigmoidoscopy every 5 years or a colonoscopy every 10 years starting at age 35. Hepatitis C blood test. Hepatitis B blood test. Sexually transmitted disease (STD) testing. Diabetes screening. This is done by checking your blood sugar (glucose) after you have not eaten for a while (fasting). You may have this done every 1-3 years. Bone density scan. This is done to screen for osteoporosis. You may have this done starting at age 35. Mammogram. This may be done every 1-2 years. Talk to your health care provider about how often you should have regular mammograms. Talk with your health care provider about your test results, treatment options, and if necessary, the need for more tests. Vaccines  Your health care provider may recommend certain vaccines, such as: Influenza vaccine. This is recommended every year. Tetanus, diphtheria, and acellular pertussis (Tdap, Td) vaccine. You may need a Td booster every 10 years. Zoster vaccine. You may need this after age 75. Pneumococcal 13-valent conjugate (PCV13) vaccine. One dose is recommended after age 21. Pneumococcal polysaccharide (PPSV23) vaccine. One dose is recommended after age 78. Talk to your health care provider about which screenings and vaccines you need and how often you need them. This information is not intended to  replace advice given to you by your health care provider. Make sure you discuss any questions you have with your health care provider. Document Released: 10/18/2015 Document Revised: 06/10/2016 Document Reviewed: 07/23/2015 Elsevier Interactive Patient Education  2017 [employer].  Fall Prevention in the Home Falls can cause injuries. They can happen to people of all ages. There are many things you can do to make your home safe and to help prevent falls. What can I do on the outside of my home? Regularly fix the edges of walkways and driveways and fix any cracks. Remove anything that might make you trip as you walk through a door, such as a raised step or threshold. Trim any bushes or trees on the path to your home. Use bright outdoor lighting. Clear any walking paths of anything that might make someone trip, such as rocks or tools. Regularly check to see if handrails are loose or broken. Make sure that both sides of any steps have handrails. Any raised decks and porches should have guardrails on the edges. Have any leaves, snow, or ice cleared regularly. Use sand or salt on walking  paths during winter. Clean up any spills in your garage right away. This includes oil or grease spills. What can I do in the bathroom? Use night lights. Install grab bars by the toilet and in the tub and shower. Do not use towel bars as grab bars. Use non-skid mats or decals in the tub or shower. If you need to sit down in the shower, use a plastic, non-slip stool. Keep the floor dry. Clean up any water that spills on the floor as soon as it happens. Remove soap buildup in the tub or shower regularly. Attach bath mats securely with double-sided non-slip rug tape. Do not have throw rugs and other things on the floor that can make you trip. What can I do in the bedroom? Use night lights. Make sure that you have a light by your bed that is easy to reach. Do not use any sheets or blankets that are too big for  your bed. They should not hang down onto the floor. Have a firm chair that has side arms. You can use this for support while you get dressed. Do not have throw rugs and other things on the floor that can make you trip. What can I do in the kitchen? Clean up any spills right away. Avoid walking on wet floors. Keep items that you use a lot in easy-to-reach places. If you need to reach something above you, use a strong step stool that has a grab bar. Keep electrical cords out of the way. Do not use floor polish or wax that makes floors slippery. If you must use wax, use non-skid floor wax. Do not have throw rugs and other things on the floor that can make you trip. What can I do with my stairs? Do not leave any items on the stairs. Make sure that there are handrails on both sides of the stairs and use them. Fix handrails that are broken or loose. Make sure that handrails are as long as the stairways. Check any carpeting to make sure that it is firmly attached to the stairs. Fix any carpet that is loose or worn. Avoid having throw rugs at the top or bottom of the stairs. If you do have throw rugs, attach them to the floor with carpet tape. Make sure that you have a light switch at the top of the stairs and the bottom of the stairs. If you do not have them, ask someone to add them for you. What else can I do to help prevent falls? Wear shoes that: Do not have high heels. Have rubber bottoms. Are comfortable and fit you well. Are closed at the toe. Do not wear sandals. If you use a stepladder: Make sure that it is fully opened. Do not climb a closed stepladder. Make sure that both sides of the stepladder are locked into place. Ask someone to hold it for you, if possible. Clearly mark and make sure that you can see: Any grab bars or handrails. First and last steps. Where the edge of each step is. Use tools that help you move around (mobility aids) if they are needed. These  include: Canes. Walkers. Scooters. Crutches. Turn on the lights when you go into a dark area. Replace any light bulbs as soon as they burn out. Set up your furniture so you have a clear path. Avoid moving your furniture around. If any of your floors are uneven, fix them. If there are any pets around you, be aware of where they are.  Review your medicines with your doctor. Some medicines can make you feel dizzy. This can increase your chance of falling. Ask your doctor what other things that you can do to help prevent falls. This information is not intended to replace advice given to you by your health care provider. Make sure you discuss any questions you have with your health care provider. Document Released: 07/18/2009 Document Revised: 02/27/2016 Document Reviewed: 10/26/2014 Elsevier Interactive Patient Education  2017 [employer].

## 2023-04-12 NOTE — Progress Notes (Signed)
Subjective:   Brooke Morse is a 77 y.o. female who presents for Medicare Annual (Subsequent) preventive examination.  Visit Complete: Virtual  I connected with  Brooke Morse on 04/12/23 by a audio enabled telemedicine application and verified that I am speaking with the correct person using two identifiers.  Patient Location: Other:  parked car  Provider Location: Office/Clinic  I discussed the limitations of evaluation and management by telemedicine. The patient expressed understanding and agreed to proceed.   Review of Systems      Cardiac Risk Factors include: advanced age (>28men, >98 women);hypertension;diabetes mellitus;dyslipidemia;sedentary lifestyle     Objective:    Today's Vitals   04/12/23 1431  Weight: 144 lb (65.3 kg)  Height: 5' 6.5" (1.689 m)   Body mass index is 22.89 kg/m.     04/12/2023    2:40 PM 04/08/2022    2:08 PM 01/20/2018   11:13 AM  Advanced Directives  Does Patient Have a Medical Advance Directive? No No No  Would patient like information on creating a medical advance directive? No - Patient declined No - Patient declined Yes (MAU/Ambulatory/Procedural Areas - Information given)    Current Medications (verified) Outpatient Encounter Medications as of 04/12/2023  Medication Sig   Fiber, Guar Gum, CHEW Chew 1 tablet by mouth daily.   insulin lispro (HUMALOG) 100 UNIT/ML KiwkPen Inject 12 Units as directed 3 (three) times daily.   Insulin Pen Needle (FIFTY50 PEN NEEDLES) 32G X 4 MM MISC USE FOUR TIMES A DAY AS DIRECTED.   Lancets (ONETOUCH DELICA PLUS LANCET33G) MISC USE TO TEST BLOOD SUGAR FOUR TIMES DAILY AS INSTRUCTED   METAMUCIL FIBER PO Take by mouth.   Multiple Vitamin (MULTIVITAMIN WITH MINERALS) TABS tablet Take 1 tablet by mouth daily.   ONETOUCH ULTRA test strip USE 1 STRIP TO CHECK GLUCOSE 4 TIMES DAILY   ascorbic acid (VITAMIN C) 1000 MG tablet Take by mouth. (Patient not taking: Reported on 04/12/2023)   aspirin 81 MG tablet Take 81  mg by mouth. About 2 times a week (Patient not taking: Reported on 04/12/2023)   atorvastatin (LIPITOR) 20 MG tablet Take 20 mg by mouth. 2 a week (Patient not taking: Reported on 04/12/2023)   citalopram (CELEXA) 20 MG tablet Take 1 tablet (20 mg total) by mouth daily. for anxiety and depression. (Patient not taking: Reported on 04/12/2023)   memantine (NAMENDA) 5 MG tablet TAKE 1 TABLET BY MOUTH ONCE DAILY FOR 1 WEEK, THEN INCREASE TO 1 TABLET TWICE DAILY THEREAFTER FOR MEMORY (Patient not taking: Reported on 04/12/2023)   Omega-3 Fatty Acids (OMEGA-3 1450 PO) Take by mouth. (Patient not taking: Reported on 04/12/2023)   No facility-administered encounter medications on file as of 04/12/2023.    Allergies (verified) Peanut-containing drug products   History: Past Medical History:  Diagnosis Date   Diabetes mellitus without complication (HCC)    Hyperlipemia    Past Surgical History:  Procedure Laterality Date   bunion removal     COLONOSCOPY WITH PROPOFOL N/A 03/18/2015   Procedure: COLONOSCOPY WITH PROPOFOL;  Surgeon: Wallace Cullens, MD;  Location: [facility] ENDOSCOPY;  Service: Gastroenterology;  Laterality: N/A;   Family History  Problem Relation Age of Onset   Colon cancer Father    Asthma Father    Social History   Socioeconomic History   Marital status: Divorced    Spouse name: Not on file   Number of children: Not on file   Years of education: Not on file  Highest education level: Not on file  Occupational History   Not on file  Tobacco Use   Smoking status: Never   Smokeless tobacco: Never  Vaping Use   Vaping Use: Never used  Substance and Sexual Activity   Alcohol use: No   Drug use: No   Sexual activity: Not on file  Other Topics Concern   Not on file  Social History Narrative   Divorced.   2 children, 5 grandchildren.   Retired. Once worked at a cigarette factory.   Enjoys sewing, cooking, walks 2 miles daily.    Social Determinants of Health   Financial Resource  Strain: Low Risk  (04/12/2023)   Overall Financial Resource Strain (CARDIA)    Difficulty of Paying Living Expenses: Not hard at all  Food Insecurity: No Food Insecurity (04/12/2023)   Hunger Vital Sign    Worried About Running Out of Food in the Last Year: Never true    Ran Out of Food in the Last Year: Never true  Transportation Needs: No Transportation Needs (04/12/2023)   PRAPARE - Administrator, Civil Service (Medical): No    Lack of Transportation (Non-Medical): No  Physical Activity: Inactive (04/12/2023)   Exercise Vital Sign    Days of Exercise per Week: 0 days    Minutes of Exercise per Session: 0 min  Stress: No Stress Concern Present (04/12/2023)   [employer] of Occupational Health - Occupational Stress Questionnaire    Feeling of Stress : Not at all  Social Connections: Socially Isolated (04/12/2023)   Social Connection and Isolation Panel [NHANES]    Frequency of Communication with Friends and Family: Once a week    Frequency of Social Gatherings with Friends and Family: More than three times a week    Attends Religious Services: Never    Database administrator or Organizations: No    Attends Engineer, structural: Never    Marital Status: Divorced    Tobacco Counseling Counseling given: Not Answered   Clinical Intake:  Pre-visit preparation completed: Yes  Pain : No/denies pain     BMI - recorded: 22.89 Nutritional Risks: None Diabetes: Yes CBG done?: Yes (179 this am per pt) CBG resulted in Enter/ Edit results?: No Did pt. bring in CBG monitor from home?: No  How often do you need to have someone help you when you read instructions, pamphlets, or other written materials from your doctor or pharmacy?: 1 - Never  Interpreter Needed?: No  Information entered by :: C.Samhitha Rosen LPN   Activities of Daily Living    04/12/2023    2:42 PM  In your present state of health, do you have any difficulty performing the following activities:   Hearing? 0  Vision? 0  Difficulty concentrating or making decisions? 0  Walking or climbing stairs? 0  Dressing or bathing? 0  Doing errands, shopping? 0  Preparing Food and eating ? N  Using the Toilet? N  In the past six months, have you accidently leaked urine? N  Do you have problems with loss of bowel control? N  Managing your Medications? N  Managing your Finances? N  Housekeeping or managing your Housekeeping? N    Patient Care Team: [name], NP as PCP - General (Internal Medicine)  Indicate any recent Medical Services you may have received from other than [facility] providers in the past year (date may be approximate).     Assessment:   This is a routine wellness examination  for [employer].  Hearing/Vision screen Hearing Screening - Comments:: No hearing difficulty Vision Screening - Comments:: Glasses for reading- [facility] - UTD on exams  Dietary issues and exercise activities discussed:     Goals Addressed             This Visit's Progress    Patient Stated       Take medications as prescribed and  increase activity.       Depression Screen    04/12/2023    2:39 PM 12/03/2022   10:44 AM 10/14/2022   11:19 AM 04/08/2022    2:02 PM 03/05/2022   11:39 AM 01/20/2018   11:12 AM  PHQ 2/9 Scores  PHQ - 2 Score 0 0 2 0 0 0  PHQ- 9 Score  0 6 0 3 0    Fall Risk    04/12/2023    2:42 PM 12/03/2022   10:44 AM 10/14/2022   11:01 AM 04/08/2022    2:05 PM 03/05/2022   11:40 AM  Fall Risk   Falls in the past year? 0 1 1 0 1  Number falls in past yr: 0 0 0 0 0  Injury with Fall? 0 0 0 0 0  Risk for fall due to : No Fall Risks History of fall(s) Impaired balance/gait No Fall Risks Impaired balance/gait  Follow up Falls prevention discussed;Falls evaluation completed Falls evaluation completed Falls evaluation completed  Falls evaluation completed    MEDICARE RISK AT HOME:  Medicare Risk at Home - 04/12/23 1443     Any stairs in or around the home? Yes    If  so, are there any without handrails? No    Home free of loose throw rugs in walkways, pet beds, electrical cords, etc? Yes    Adequate lighting in your home to reduce risk of falls? Yes    Life alert? No    Use of a cane, walker or w/c? Yes   walker, cane   Grab bars in the bathroom? Yes    Shower chair or bench in shower? Yes    Elevated toilet seat or a handicapped toilet? Yes             TIMED UP AND GO:  Was the test performed?  No    Cognitive Function:    10/14/2022   11:07 AM 01/20/2018   11:25 AM  MMSE - Mini Mental State Exam  Orientation to time 5 5  Orientation to Place 5 5  Registration 3 3  Attention/ Calculation 5 0  Recall 2 0  Recall-comments  unable to recall 3 of 3 words  Language- name 2 objects 2 0  Language- repeat 1 1  Language- follow 3 step command 3 3  Language- read & follow direction 1 0  Write a sentence 1 0  Copy design 1 0  Total score 29 17        04/12/2023    2:44 PM 04/08/2022    2:06 PM  6CIT Screen  What Year? 0 points 0 points  What month? 0 points 0 points  What time? 0 points 0 points  Count back from 20 0 points 0 points  Months in reverse 4 points 0 points  Repeat phrase 2 points 0 points  Total Score 6 points 0 points    Immunizations Immunization History  Administered Date(s) Administered   Pneumococcal Polysaccharide-23 01/07/2015    TDAP status: Due, Education has been provided regarding the importance of this  vaccine. Advised may receive this vaccine at local pharmacy or Health Dept. Aware to provide a copy of the vaccination record if obtained from local pharmacy or Health Dept. Verbalized acceptance and understanding.  Flu Vaccine status: Declined, Education has been provided regarding the importance of this vaccine but patient still declined. Advised may receive this vaccine at local pharmacy or Health Dept. Aware to provide a copy of the vaccination record if obtained from local pharmacy or Health Dept.  Verbalized acceptance and understanding.  Pneumococcal vaccine status: Declined,  Education has been provided regarding the importance of this vaccine but patient still declined. Advised may receive this vaccine at local pharmacy or Health Dept. Aware to provide a copy of the vaccination record if obtained from local pharmacy or Health Dept. Verbalized acceptance and understanding.   Covid-19 vaccine status: Declined, Education has been provided regarding the importance of this vaccine but patient still declined. Advised may receive this vaccine at local pharmacy or Health Dept.or vaccine clinic. Aware to provide a copy of the vaccination record if obtained from local pharmacy or Health Dept. Verbalized acceptance and understanding.  Qualifies for Shingles Vaccine? Yes   Zostavax completed  declined   Shingrix Completed?: No.    Education has been provided regarding the importance of this vaccine. Patient has been advised to call insurance company to determine out of pocket expense if they have not yet received this vaccine. Advised may also receive vaccine at local pharmacy or Health Dept. Verbalized acceptance and understanding.  Screening Tests Health Maintenance  Topic Date Due   Diabetic kidney evaluation - Urine ACR  Never done   Zoster Vaccines- Shingrix (1 of 2) Never done   Pneumonia Vaccine 80+ Years old (2 of 2 - PCV) 01/07/2016   HEMOGLOBIN A1C  01/08/2022   FOOT EXAM  07/10/2022   Diabetic kidney evaluation - eGFR measurement  03/06/2023   OPHTHALMOLOGY EXAM  02/04/2024 (Originally 02/01/1956)   INFLUENZA VACCINE  05/06/2023   Medicare Annual Wellness (AWV)  04/11/2024   Colonoscopy  03/17/2025   DEXA SCAN  Completed   Hepatitis C Screening  Completed   HPV VACCINES  Aged Out   DTaP/Tdap/Td  Discontinued   COVID-19 Vaccine  Discontinued    Health Maintenance  Health Maintenance Due  Topic Date Due   Diabetic kidney evaluation - Urine ACR  Never done   Zoster Vaccines-  Shingrix (1 of 2) Never done   Pneumonia Vaccine 42+ Years old (2 of 2 - PCV) 01/07/2016   HEMOGLOBIN A1C  01/08/2022   FOOT EXAM  07/10/2022   Diabetic kidney evaluation - eGFR measurement  03/06/2023    Colorectal cancer screening: No longer required.   Mammogram status: Ordered 04/12/23. Pt provided with contact info and advised to call to schedule appt.   Bone scan - patient declined  Lung Cancer Screening: (Low Dose CT Chest recommended if Age 84-80 years, 20 pack-year currently smoking OR have quit w/in 15years.) does not qualify.   Lung Cancer Screening Referral: no  Additional Screening:  Hepatitis C Screening: does qualify; Completed 01/20/18  Vision Screening: Recommended annual ophthalmology exams for early detection of glaucoma and other disorders of the eye. Is the patient up to date with their annual eye exam?  Yes  Who is the provider or what is the name of the office in which the patient attends annual eye exams? [facility] If pt is not established with a provider, would they like to be referred to a provider  to establish care? Yes .   Dental Screening: Recommended annual dental exams for proper oral hygiene  Diabetic Foot Exam: Diabetic Foot Exam: Completed 07/10/21  Community Resource Referral / Chronic Care Management: CRR required this visit?  No   CCM required this visit?  No     Plan:     I have personally reviewed and noted the following in the patient's chart:   Medical and social history Use of alcohol, tobacco or illicit drugs  Current medications and supplements including opioid prescriptions. Patient is not currently taking opioid prescriptions. Functional ability and status Nutritional status Physical activity Advanced directives List of other physicians Hospitalizations, surgeries, and ER visits in previous 12 months Vitals Screenings to include cognitive, depression, and falls Referrals and appointments  In addition, I have  reviewed and discussed with patient certain preventive protocols, quality metrics, and best practice recommendations. A written personalized care plan for preventive services as well as general preventive health recommendations were provided to patient.     Maryan Puls, LPN   12/11/1015   After Visit Summary: (MyChart) Due to this being a telephonic visit, the after visit summary with patients personalized plan was offered to patient via MyChart   Nurse Notes: order placed for mammogram. Pt declined dexa scan.  Vaccinations: declines all Influenza vaccine: recommend every Fall Pneumococcal vaccine: recommend once per lifetime Prevnar-20 Tdap vaccine: recommend every 10 years Shingles vaccine: recommend Shingrix which is 2 doses 2-6 months apart and over 90% effective     Covid-19: recommend 2 doses one month apart with a booster 6 months later

## 2023-05-13 DIAGNOSIS — E785 Hyperlipidemia, unspecified: Secondary | ICD-10-CM | POA: Diagnosis not present

## 2023-05-13 DIAGNOSIS — E1069 Type 1 diabetes mellitus with other specified complication: Secondary | ICD-10-CM | POA: Diagnosis not present

## 2023-05-13 DIAGNOSIS — E10649 Type 1 diabetes mellitus with hypoglycemia without coma: Secondary | ICD-10-CM | POA: Diagnosis not present

## 2023-05-28 DIAGNOSIS — E10649 Type 1 diabetes mellitus with hypoglycemia without coma: Secondary | ICD-10-CM | POA: Diagnosis not present

## 2023-05-31 DIAGNOSIS — E10649 Type 1 diabetes mellitus with hypoglycemia without coma: Secondary | ICD-10-CM | POA: Diagnosis not present

## 2023-05-31 DIAGNOSIS — E785 Hyperlipidemia, unspecified: Secondary | ICD-10-CM | POA: Diagnosis not present

## 2023-05-31 DIAGNOSIS — E1069 Type 1 diabetes mellitus with other specified complication: Secondary | ICD-10-CM | POA: Diagnosis not present

## 2023-05-31 LAB — PROTEIN / CREATININE RATIO, URINE
Albumin, U: 12
Creatinine, Urine: 109.9

## 2023-05-31 LAB — MICROALBUMIN / CREATININE URINE RATIO: Microalb Creat Ratio: 10.9

## 2023-06-01 DIAGNOSIS — E10649 Type 1 diabetes mellitus with hypoglycemia without coma: Secondary | ICD-10-CM | POA: Diagnosis not present

## 2023-06-06 DIAGNOSIS — E109 Type 1 diabetes mellitus without complications: Secondary | ICD-10-CM | POA: Diagnosis not present

## 2023-08-04 ENCOUNTER — Other Ambulatory Visit: Payer: Self-pay | Admitting: Primary Care

## 2023-08-04 DIAGNOSIS — R6889 Other general symptoms and signs: Secondary | ICD-10-CM

## 2023-08-04 NOTE — Telephone Encounter (Signed)
Received refill request for memantine (Namenda) for memory.  I see on her chart that she is "not taking".  Is she taking the medication or not?

## 2023-08-05 NOTE — Telephone Encounter (Signed)
Unable to reach patient. Unable to leave voicemail.  

## 2023-08-06 NOTE — Telephone Encounter (Signed)
Called and spoke with patients daughter, she advised patient is currently taking memantine. She states she has responded to the medication very well and has noticed improvement

## 2023-08-06 NOTE — Telephone Encounter (Signed)
Noted Rx sent to pharmacy  

## 2023-09-16 DIAGNOSIS — E10649 Type 1 diabetes mellitus with hypoglycemia without coma: Secondary | ICD-10-CM | POA: Diagnosis not present

## 2023-09-16 DIAGNOSIS — E1069 Type 1 diabetes mellitus with other specified complication: Secondary | ICD-10-CM | POA: Diagnosis not present

## 2023-09-16 DIAGNOSIS — E785 Hyperlipidemia, unspecified: Secondary | ICD-10-CM | POA: Diagnosis not present

## 2023-09-16 LAB — HEMOGLOBIN A1C: Hemoglobin A1C: 8.7

## 2023-10-22 ENCOUNTER — Ambulatory Visit (INDEPENDENT_AMBULATORY_CARE_PROVIDER_SITE_OTHER): Payer: Medicare Other | Admitting: Primary Care

## 2023-10-22 ENCOUNTER — Encounter: Payer: Self-pay | Admitting: Primary Care

## 2023-10-22 VITALS — BP 128/74 | HR 66 | Temp 97.4°F | Ht 66.5 in | Wt 151.0 lb

## 2023-10-22 DIAGNOSIS — R112 Nausea with vomiting, unspecified: Secondary | ICD-10-CM | POA: Diagnosis not present

## 2023-10-22 DIAGNOSIS — R6889 Other general symptoms and signs: Secondary | ICD-10-CM | POA: Diagnosis not present

## 2023-10-22 DIAGNOSIS — R2689 Other abnormalities of gait and mobility: Secondary | ICD-10-CM | POA: Diagnosis not present

## 2023-10-22 LAB — COMPREHENSIVE METABOLIC PANEL
ALT: 114 U/L — ABNORMAL HIGH (ref 0–35)
AST: 46 U/L — ABNORMAL HIGH (ref 0–37)
Albumin: 3.8 g/dL (ref 3.5–5.2)
Alkaline Phosphatase: 115 U/L (ref 39–117)
BUN: 14 mg/dL (ref 6–23)
CO2: 30 meq/L (ref 19–32)
Calcium: 9.3 mg/dL (ref 8.4–10.5)
Chloride: 101 meq/L (ref 96–112)
Creatinine, Ser: 0.72 mg/dL (ref 0.40–1.20)
GFR: 81.62 mL/min (ref >60.00)
Glucose, Bld: 321 mg/dL — ABNORMAL HIGH (ref 70–99)
Potassium: 4.7 meq/L (ref 3.5–5.1)
Sodium: 137 meq/L (ref 135–145)
Total Bilirubin: 0.8 mg/dL (ref 0.2–1.2)
Total Protein: 6.6 g/dL (ref 6.0–8.3)

## 2023-10-22 LAB — CBC WITH DIFFERENTIAL/PLATELET
Basophils Absolute: 0 10*3/uL (ref 0.0–0.1)
Basophils Relative: 0.8 % (ref 0.0–3.0)
Eosinophils Absolute: 0.1 10*3/uL (ref 0.0–0.7)
Eosinophils Relative: 1.4 % (ref 0.0–5.0)
HCT: 41.7 % (ref 36.0–46.0)
Hemoglobin: 13.1 g/dL (ref 12.0–15.0)
Lymphocytes Relative: 19.6 % (ref 12.0–46.0)
Lymphs Abs: 0.9 10*3/uL (ref 0.7–4.0)
MCHC: 31.3 g/dL (ref 30.0–36.0)
MCV: 84.2 fL (ref 78.0–100.0)
Monocytes Absolute: 0.4 10*3/uL (ref 0.1–1.0)
Monocytes Relative: 9.1 % (ref 3.0–12.0)
Neutro Abs: 3.3 10*3/uL (ref 1.4–7.7)
Neutrophils Relative %: 69.1 % (ref 43.0–77.0)
Platelets: 207 10*3/uL (ref 150.0–400.0)
RBC: 4.95 Mil/uL (ref 3.87–5.11)
RDW: 14.8 % (ref 11.5–15.5)
WBC: 4.8 10*3/uL (ref 4.0–10.5)

## 2023-10-22 LAB — LIPASE: Lipase: 12 U/L (ref 11.0–59.0)

## 2023-10-22 NOTE — Progress Notes (Signed)
Subjective:    Patient ID: Brooke Morse, female    DOB: 25-Sep-1946, 78 y.o.   MRN: [id number]  Emesis  Associated symptoms include abdominal pain. Pertinent negatives include no diarrhea or dizziness.    [name] is a very pleasant 78 y.o. female with a history of type 1 diabetes, hypertension, hyperlipidemia, imbalance, dizziness, fatigue who presents today to discuss several concerns.  Her daughter joins [place] today.  1) Imbalance/Instability: She has been participating in a physical fitness program at home with a physical trainer for the last 3 months.  Her physical trainer focused on core and stability training through various exercises.  She also walked on the treadmill for 20 minutes 4 days weekly.  Upon conclusion of her 73-month training session her overall strength increased, but her physical trainer saw little improvement in her day-to-day movement such as walking, standing, and sitting.  Her physical trainer also noted her difficulty remembering to walk and maneuver for which she was taught.  She also had difficulty remembering to perform routine exercises that have been performed numerous times previously.  Her daughter has noticed that she shuffles when walking and will drag/slide her feet at times. She doesn't stand upright, she has a bend at the waist and knee. She is unable to stand unassisted and seems to have a weak core despite her physical training sessions.  She admits to feeling unstable when walking in her home, has to hold onto objects. She admits that her feet shuffle and drag when walking. She is forgetful of taking medications at times.  She denies forgetting objects in her home or recent conversations.  She a family history of parkinson's disease. She is managed on memantine 5 mg BID and her daughter believes there's not been a progression in memory loss.   She denies lower back pain, lower extremity pain, paresthesias to lower extremities, tremors.  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 About 1 week ago she developed a "bubble" and bloating sensation to the epigastric region. Throughout that day she experienced numerous episodes of vomiting.  Symptoms resolved later that day.  Since then she denies abdominal pain, nausea, vomiting, diarrhea, fevers, chills, body aches, changes in her diet, laying flat within 2 hours of a meal.  She has a history of chronic constipation, no worse than usual.   Review of Systems  Gastrointestinal:  Positive for abdominal pain, constipation, nausea and vomiting. Negative for diarrhea.  Musculoskeletal:  Negative for back pain.  Neurological:  Positive for weakness. Negative for dizziness and tremors.       Altered gait         Past Medical History:  Diagnosis Date   Diabetes mellitus without complication (HCC)    Hyperlipemia     Social History   Socioeconomic History   Marital status: Divorced    Spouse name: Not on file   Number of children: Not on file   Years of education: Not on file   Highest education level: Not on file  Occupational History   Not on file  Tobacco Use   Smoking status: Never   Smokeless tobacco: Never  Vaping Use   Vaping status: Never Used  Substance and Sexual Activity   Alcohol use: No   Drug use: No   Sexual activity: Not on file  Other Topics Concern   Not on file  Social History Narrative   Divorced.   2 children, 5 grandchildren.   Retired.  Once worked at a cigarette factory.   Enjoys sewing, cooking, walks 2 miles daily.    Social Drivers of Corporate investment banker Strain: Low Risk  (04/12/2023)   Overall Financial Resource Strain (CARDIA)    Difficulty of Paying Living Expenses: Not hard at all  Food Insecurity: No Food Insecurity (04/12/2023)   Hunger Vital Sign    Worried About Running Out of Food in the Last Year: Never true    Ran Out of Food in the Last Year: Never true  Transportation Needs: No  Transportation Needs (04/12/2023)   PRAPARE - Administrator, Civil Service (Medical): No    Lack of Transportation (Non-Medical): No  Physical Activity: Inactive (04/12/2023)   Exercise Vital Sign    Days of Exercise per Week: 0 days    Minutes of Exercise per Session: 0 min  Stress: No Stress Concern Present (04/12/2023)   [employer] of Occupational Health - Occupational Stress Questionnaire    Feeling of Stress : Not at all  Social Connections: Socially Isolated (04/12/2023)   Social Connection and Isolation Panel [NHANES]    Frequency of Communication with Friends and Family: Once a week    Frequency of Social Gatherings with Friends and Family: More than three times a week    Attends Religious Services: Never    Database administrator or Organizations: No    Attends Banker Meetings: Never    Marital Status: Divorced  Catering manager Violence: Not At Risk (04/12/2023)   Humiliation, Afraid, Rape, and Kick questionnaire    Fear of Current or Ex-Partner: No    Emotionally Abused: No    Physically Abused: No    Sexually Abused: No    Past Surgical History:  Procedure Laterality Date   bunion removal     COLONOSCOPY WITH PROPOFOL N/A 03/18/2015   Procedure: COLONOSCOPY WITH PROPOFOL;  Surgeon: Wallace Cullens, MD;  Location: [facility] ENDOSCOPY;  Service: Gastroenterology;  Laterality: N/A;    Family History  Problem Relation Age of Onset   Colon cancer Father    Asthma Father     Allergies  Allergen Reactions   Peanut-Containing Drug Products Itching    Current Outpatient Medications on File Prior to Visit  Medication Sig Dispense Refill   atorvastatin (LIPITOR) 20 MG tablet Take 20 mg by mouth. 2 a week     Continuous Glucose Sensor (DEXCOM G7 SENSOR) MISC USE 1 EACH EVERY 10 DAYS     Fiber, Guar Gum, CHEW Chew 1 tablet by mouth daily.     insulin lispro (HUMALOG) 100 UNIT/ML KiwkPen Inject 12 Units as directed 3 (three) times daily.     Insulin Pen  Needle (FIFTY50 PEN NEEDLES) 32G X 4 MM MISC USE FOUR TIMES A DAY AS DIRECTED.     Lancets (ONETOUCH DELICA PLUS LANCET33G) MISC USE TO TEST BLOOD SUGAR FOUR TIMES DAILY AS INSTRUCTED     LANTUS SOLOSTAR 100 UNIT/ML Solostar Pen Inject 8 Units into the skin daily.     memantine (NAMENDA) 5 MG tablet Take 1 tablet (5 mg total) by mouth 2 (two) times daily. For memory 180 tablet 0   Multiple Vitamin (MULTIVITAMIN WITH MINERALS) TABS tablet Take 1 tablet by mouth daily.     ONETOUCH ULTRA test strip USE 1 STRIP TO CHECK GLUCOSE 4 TIMES DAILY     ascorbic acid (VITAMIN C) 1000 MG tablet Take by mouth. (Patient not taking: Reported on 10/22/2023)     aspirin  81 MG tablet Take 81 mg by mouth. About 2 times a week (Patient not taking: Reported on 10/22/2023)     citalopram (CELEXA) 20 MG tablet Take 1 tablet (20 mg total) by mouth daily. for anxiety and depression. (Patient not taking: Reported on 10/22/2023) 90 tablet 0   METAMUCIL FIBER PO Take by mouth. (Patient not taking: Reported on 10/22/2023)     Omega-3 Fatty Acids (OMEGA-3 1450 PO) Take by mouth. (Patient not taking: Reported on 10/22/2023)     No current facility-administered medications on file prior to visit.    BP 128/74   Pulse 66   Temp (!) 97.4 F (36.3 C) (Temporal)   Ht 5' 6.5" (1.689 m)   Wt 151 lb (68.5 kg)   SpO2 95%   BMI 24.01 kg/m  Objective:   Physical Exam Cardiovascular:     Rate and Rhythm: Normal rate and regular rhythm.  Pulmonary:     Effort: Pulmonary effort is normal.     Breath sounds: Normal breath sounds.  Abdominal:     General: Bowel sounds are normal.     Palpations: Abdomen is soft.     Tenderness: There is no abdominal tenderness.  Musculoskeletal:     Cervical back: Neck supple.     Comments: 5 out of 5 strength to lower extremities upon seated position.  Hesitant when walking in the office and moving to exam table.  Forward/hunched posture upon ambulation.  Skin:    General: Skin is warm and dry.   Neurological:     Mental Status: She is alert and oriented to person, place, and time.     Motor: No tremor.     Gait: Gait abnormal.     Deep Tendon Reflexes:     Reflex Scores:      Patellar reflexes are 2+ on the right side and 2+ on the left side. Psychiatric:        Mood and Affect: Mood normal.           Assessment & Plan:  Shuffling gait Assessment & Plan: Surprising given rigorous 62-month physical training program.  Referral placed to neurology to rule out neurological causes such as Parkinson's disease, etc. She and her daughter agree.  Orders: -     Ambulatory referral to Neurology  Imbalance Assessment & Plan: Surprising given rigorous 28-month physical training program.  Referral placed to neurology to rule out neurological causes such as Parkinson's disease, etc. She and her daughter agree.  Orders: -     Ambulatory referral to Neurology  Forgetfulness Assessment & Plan: Stable.  Continue memantine 5 mg twice daily. Referral placed to neurology.  Orders: -     Ambulatory referral to Neurology  Nausea and vomiting, unspecified vomiting type Assessment & Plan: Resolved.  Checking labs today including lipase, CBC with differential, CMP.   Orders: -     CBC with Differential/Platelet -     Comprehensive metabolic panel -     Lipase        [name], NP

## 2023-10-22 NOTE — Assessment & Plan Note (Signed)
Resolved.  Checking labs today including lipase, CBC with differential, CMP.

## 2023-10-22 NOTE — Patient Instructions (Signed)
Stop by the lab prior to leaving today. I will notify you of your results once received.   You will either be contacted via phone regarding your referral to neurology, or you may receive a letter on your MyChart portal from our referral team with instructions for scheduling an appointment. Please let us know if you have not been contacted by anyone within two weeks.  It was a pleasure to see you today!

## 2023-10-22 NOTE — Assessment & Plan Note (Signed)
Stable.  Continue memantine 5 mg twice daily. Referral placed to neurology.

## 2023-10-22 NOTE — Assessment & Plan Note (Signed)
Surprising given rigorous 45-month physical training program.  Referral placed to neurology to rule out neurological causes such as Parkinson's disease, etc. She and her daughter agree.

## 2023-10-23 DIAGNOSIS — R7989 Other specified abnormal findings of blood chemistry: Secondary | ICD-10-CM

## 2023-11-01 ENCOUNTER — Encounter: Payer: Self-pay | Admitting: Neurology

## 2023-11-02 ENCOUNTER — Encounter: Payer: Self-pay | Admitting: Primary Care

## 2023-11-05 DIAGNOSIS — E119 Type 2 diabetes mellitus without complications: Secondary | ICD-10-CM | POA: Diagnosis not present

## 2023-11-05 DIAGNOSIS — H52212 Irregular astigmatism, left eye: Secondary | ICD-10-CM | POA: Diagnosis not present

## 2023-11-05 DIAGNOSIS — H25042 Posterior subcapsular polar age-related cataract, left eye: Secondary | ICD-10-CM | POA: Diagnosis not present

## 2023-11-05 DIAGNOSIS — H524 Presbyopia: Secondary | ICD-10-CM | POA: Diagnosis not present

## 2023-11-05 DIAGNOSIS — H2513 Age-related nuclear cataract, bilateral: Secondary | ICD-10-CM | POA: Diagnosis not present

## 2023-11-05 DIAGNOSIS — H5203 Hypermetropia, bilateral: Secondary | ICD-10-CM | POA: Diagnosis not present

## 2023-11-05 LAB — HM DIABETES EYE EXAM

## 2023-11-16 ENCOUNTER — Ambulatory Visit
Admission: RE | Admit: 2023-11-16 | Discharge: 2023-11-16 | Disposition: A | Discharge status: Home / Self Care | Payer: Medicare Other | Source: Ambulatory Visit | Attending: Primary Care | Admitting: Primary Care

## 2023-11-16 DIAGNOSIS — K8020 Calculus of gallbladder without cholecystitis without obstruction: Secondary | ICD-10-CM | POA: Diagnosis not present

## 2023-11-16 DIAGNOSIS — R7989 Other specified abnormal findings of blood chemistry: Secondary | ICD-10-CM | POA: Diagnosis not present

## 2023-11-16 DIAGNOSIS — K838 Other specified diseases of biliary tract: Secondary | ICD-10-CM | POA: Diagnosis not present

## 2023-11-16 DIAGNOSIS — R1010 Upper abdominal pain, unspecified: Secondary | ICD-10-CM

## 2023-11-16 DIAGNOSIS — R748 Abnormal levels of other serum enzymes: Secondary | ICD-10-CM | POA: Diagnosis not present

## 2023-11-17 ENCOUNTER — Telehealth: Payer: Self-pay

## 2023-11-17 DIAGNOSIS — R7989 Other specified abnormal findings of blood chemistry: Secondary | ICD-10-CM

## 2023-11-17 NOTE — Telephone Encounter (Signed)
Copied from CRM [id number]. Topic: Appointments - Appointment Scheduling >> Nov 17, 2023 10:09 AM Brooke Morse wrote: Lab apppointment scheduled - need live enzymes lab order put in per Shawna Clamp message 11/16/23

## 2023-11-17 NOTE — Addendum Note (Signed)
Addended by: [name] on: 11/17/2023 10:22 AM   Modules accepted: Orders

## 2023-11-17 NOTE — Telephone Encounter (Signed)
Lab orders placed.

## 2023-11-23 ENCOUNTER — Other Ambulatory Visit (INDEPENDENT_AMBULATORY_CARE_PROVIDER_SITE_OTHER): Payer: Medicare Other

## 2023-11-23 DIAGNOSIS — R7989 Other specified abnormal findings of blood chemistry: Secondary | ICD-10-CM

## 2023-11-23 LAB — HEPATIC FUNCTION PANEL
ALT: 10 U/L (ref 0–35)
AST: 17 U/L (ref 0–37)
Albumin: 4.1 g/dL (ref 3.5–5.2)
Alkaline Phosphatase: 69 U/L (ref 39–117)
Bilirubin, Direct: 0.1 mg/dL (ref 0.0–0.3)
Total Bilirubin: 0.6 mg/dL (ref 0.2–1.2)
Total Protein: 6.7 g/dL (ref 6.0–8.3)

## 2023-12-03 ENCOUNTER — Encounter: Payer: Self-pay | Admitting: *Deleted

## 2023-12-08 NOTE — Progress Notes (Signed)
 Initial neurology clinic note  Reason for Evaluation: Consultation requested by Brooke Nest, NP for an opinion regarding imbalance, weakness. My final recommendations will be communicated back to the requesting physician by way of shared medical record or letter to requesting physician via [place] mail.  HPI: This is Brooke Morse, a 78 y.o. 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 The patient is accompanied by daughter.  Patient has problems with her balance. She thinks this has been going on about 1 year. She has to hold on to things when walking. She has fallen about 3 times since 10/06/23. She thinks it is due to leg weakness. She will usually will lower herself down due to the leg weakness. There seems to be core weakness too as she can have difficulty sitting up. She has been working with a Systems analyst 3 times per week for about 5 months and there has been little improvement. She has a rollator that she uses 90% of the time. Daughter sees coordination as the biggest issue with using the rollator.   She denies numbness or tingling in legs or feet. She denies symptoms in her arms. She denies neck or back pain.  She has good sense of smell. She denies feeling frozen. Daughter does wonder though because when she loses her balance sometimes she does not take a step. She denies tremors. There is no concern for REM sleep disorder.  She does endorse some short term memory problems for which she was previously on memantine 5 mg BID. She stopped all of her medications except insulin for DM. She is able to take care of herself and handles her own finances. She lives with her daughter.  She sleeps about 5-6 hours per night. She takes a lot of naps during the day.  Per daughter her sleep hygiene could be better.  She has lost 10-15 lbs, mostly due to lack of appetite. She  denies feeling depressed or down. She denies fevers or night sweats.   report any constitutional symptoms like fever, night sweats, anorexia or unintentional weight loss.  EtOH use: None  Restrictive diet? No Family history of neuropathy/myopathy/neurologic disease? no   MEDICATIONS:  Outpatient Encounter Medications as of 12/16/2023  Medication Sig   carbidopa-levodopa (SINEMET IR) 25-100 MG tablet Take 0.5 tablets by mouth 3 (three) times daily.   Continuous Glucose Sensor (DEXCOM G7 SENSOR) MISC USE 1 EACH EVERY 10 DAYS   insulin lispro (HUMALOG) 100 UNIT/ML KiwkPen Inject 12 Units as directed 3 (three) times daily.   Insulin Pen Needle (FIFTY50 PEN NEEDLES) 32G X 4 MM MISC USE FOUR TIMES A DAY AS DIRECTED.   Lancets (ONETOUCH DELICA PLUS LANCET33G) MISC USE TO TEST BLOOD SUGAR FOUR TIMES DAILY AS INSTRUCTED   LANTUS SOLOSTAR 100 UNIT/ML Solostar Pen Inject 8 Units into the skin daily.   ascorbic acid (VITAMIN C) 1000 MG tablet Take by mouth. (Patient not taking: Reported on 04/12/2023)   aspirin 81 MG tablet Take 81 mg by mouth. About 2 times a week (Patient not taking: Reported on 10/22/2023)   atorvastatin (LIPITOR) 20 MG tablet Take 20 mg by mouth. 2 a week (Patient not taking: Reported on 12/16/2023)   citalopram (CELEXA) 20 MG tablet Take 1 tablet (20 mg total) by mouth daily. for anxiety and depression. (Patient not taking: Reported on 04/12/2023)   Fiber, Guar Gum, CHEW Chew 1  tablet by mouth daily. (Patient not taking: Reported on 12/16/2023)   memantine (NAMENDA) 5 MG tablet Take 1 tablet (5 mg total) by mouth 2 (two) times daily. For memory (Patient not taking: Reported on 12/16/2023)   METAMUCIL FIBER PO Take by mouth. (Patient not taking: Reported on 10/22/2023)   Multiple Vitamin (MULTIVITAMIN WITH MINERALS) TABS tablet Take 1 tablet by mouth daily. (Patient not taking: Reported on 12/16/2023)   Omega-3 Fatty Acids (OMEGA-3 1450 PO) Take by mouth. (Patient not taking: Reported on  12/16/2023)   ONETOUCH ULTRA test strip USE 1 STRIP TO CHECK GLUCOSE 4 TIMES DAILY (Patient not taking: Reported on 12/16/2023)   No facility-administered encounter medications on file as of 12/16/2023.    PAST MEDICAL HISTORY: Past Medical History:  Diagnosis Date   Diabetes mellitus without complication (HCC)    Hyperlipemia     PAST SURGICAL HISTORY: Past Surgical History:  Procedure Laterality Date   bunion removal     COLONOSCOPY WITH PROPOFOL N/A 03/18/2015   Procedure: COLONOSCOPY WITH PROPOFOL;  Surgeon: Wallace Cullens, MD;  Location: [facility] END[facility]COPY;  Service: Gastroenterology;  Laterality: N/A;    ALLERGIES: Allergies  Allergen Reactions   Peanut-Containing Drug Products Itching    FAMILY HISTORY: Family History  Problem Relation Age of Onset   Colon cancer Father    Asthma Father     SOCIAL HISTORY: Social History   Tobacco Use   Smoking status: Former    Types: Cigarettes   Smokeless tobacco: Never   Tobacco comments:    Quit more than 20 years ago  Vaping Use   Vaping status: Never Used  Substance Use Topics   Alcohol use: No   Drug use: No   Social History   Social History Narrative   Divorced.   2 children, 5 grandchildren.   Retired. Once worked at a cigarette factory.   Enjoys sewing, cooking, walks 2 miles daily.    Are you right handed or left handed? Right   What is your current occupation?   Who lives with you? Daughter   What type of home do you live in: 1 story or 2 story? one         OBJECTIVE: PHYSICAL EXAM: BP (!) 145/80   Pulse 62   Ht 5\' 6"  (1.676 m)   Wt 151 lb (68.5 kg)   SpO2 98%   BMI 24.37 kg/m   General: General appearance: Awake and alert. No distress. Cooperative with exam.  Skin: No obvious rash or jaundice. HEENT: Atraumatic. Anicteric. Lungs: Non-labored breathing on room air  Heart: Regular Extremities: No edema.  Musculoskeletal: No obvious joint swelling. Psych: Affect  appropriate.  Neurological: Mental Status: Alert. Speech fluent. No pseudobulbar affect Cranial Nerves: CNII: No RAPD. Visual fields grossly intact. CNIII, IV, VI: PERRL. No nystagmus. EOMI. CN V: Facial sensation intact bilaterally to fine touch.  CN VII: Facial muscles symmetric and strong. No ptosis at rest. CN VIII: Hearing grossly intact bilaterally. CN IX: No hypophonia. CN X: Palate elevates symmetrically. CN XI: Full strength shoulder shrug bilaterally. CN XII: Tongue protrusion full and midline. No atrophy or fasciculations. No significant dysarthria Motor: Tone is increased vs paratonia in all extremities. Strength is 5/5 in bilateral upper and lower extremities Reflexes:  Right Left   Bicep 2+ 2+   Tricep 2+ 2+   BrRad 2+ 2+   Knee 2+ 2+   Ankle 0 0    Pathological Reflexes: Babinski: mute response bilaterally Hoffman: absent bilaterally Troemner:  absent bilaterally Sensation: Pinprick: Diminished in bilateral feet, intact above ankles Vibration: Diminished in bilateral great toes (~6 seconds), otherwise intact Proprioception: Absent at bilateral great toes Coordination: Intact finger-to- nose-finger in RUE, mild dysmetria in LUE. Finger tapping with decrement bilaterally (R > L). Difficulty with toe tapping bilaterally. Gait: Unable to rise from chair with arms crossed unassisted. Walks with walker. Short, shuffling steps. Difficulty with turning, en bloc turning. No clear freezing.  Lab and Test Review: Internal labs: Hepatic function panel (11/23/23) unremarkable  10/22/23: CMP significant for glucose 321, AST 46, ALT 114  HbA1c (09/16/23): 8.7 B12 (05/19/22): 239  External labs: 05/31/23: TSH wnl Lipid panel: tChol 241, LDL 148, TG 77 HbA1c: 9  Imaging/Procedures: Lumbar spine xray (06/01/2007): IMPRESSION:  1. No fracture is seen.  2. There is a mild spondylolisthesis without spondylolysis at the L4-L5  level where there is also observed narrowing  of the disc space compatible  disc disease. This could be further evaluated by MR if clinically  indicated.  2. There is a nonspecific calcific appearing density superior to the L4  lumbar transverse process on the RIGHT. As noted above this likely  represents a phlebolith but the possibility of a RIGHT ureteral stone  cannot  be entirely excluded from the differential and if such is a clinical  concern, CT examination utilizing stone protocol would be recommended.   ASSESSMENT: SHANTY GINTY is a 78 y.o. female who presents for evaluation of leg weakness, imbalance, and falls. 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 Her neurological examination is pertinent for increased tone, diminished sensation in feet, decrementing finger tapping (RUE > LUE), shuffling steps, and en bloc turning. This is a complex case. 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 This can contribute to imbalance and falls, but her symptoms and difficulty walking appear out of proportion to her neuropathy. I am concerned for an overlapping process such as a parkinsonism. This is not currently clear though. I discussed with patient and her daughter including the work up and a trial of Sinemet to monitor for response.  PLAN: -Blood work: B1, B12, IFE, copper, vit E -MRI brain wo contrast -Trial of Sinemet 1/2 tablet three times daily  -Return to clinic in 3 months  The impression above as well as the plan as outlined below were extensively discussed with the patient (in the company of daughter) who voiced understanding. All questions were answered to their satisfaction.  The patient was counseled on pertinent fall precautions per the printed material provided today, and as noted under the "Patient Instructions" section below.  When available, results of the above investigations and possible further recommendations will be communicated to the patient via  telephone/MyChart. Patient to call office if not contacted after expected testing turnaround time.   Total time spent reviewing records, interview, history/exam, documentation, and coordination of care on day of encounter:  75 min   Thank you for allowing me to participate in patient's care.  If I can answer any additional questions, I would be pleased to do so.  Jacquelyne Balint, MD   CC: Brooke Nest, NP [address] [place] [address]  CC: Referring provider: [name], NP [address] [place],  [place] [address]

## 2023-12-16 ENCOUNTER — Ambulatory Visit: Payer: Medicare Other | Admitting: Neurology

## 2023-12-16 ENCOUNTER — Other Ambulatory Visit

## 2023-12-16 ENCOUNTER — Encounter: Payer: Self-pay | Admitting: Neurology

## 2023-12-16 VITALS — BP 145/80 | HR 62 | Ht 66.0 in | Wt 151.0 lb

## 2023-12-16 DIAGNOSIS — R29898 Other symptoms and signs involving the musculoskeletal system: Secondary | ICD-10-CM

## 2023-12-16 DIAGNOSIS — G20C Parkinsonism, unspecified: Secondary | ICD-10-CM | POA: Diagnosis not present

## 2023-12-16 DIAGNOSIS — R209 Unspecified disturbances of skin sensation: Secondary | ICD-10-CM

## 2023-12-16 DIAGNOSIS — R413 Other amnesia: Secondary | ICD-10-CM

## 2023-12-16 DIAGNOSIS — G629 Polyneuropathy, unspecified: Secondary | ICD-10-CM

## 2023-12-16 DIAGNOSIS — R2689 Other abnormalities of gait and mobility: Secondary | ICD-10-CM | POA: Diagnosis not present

## 2023-12-16 MED ORDER — CARBIDOPA-LEVODOPA 25-100 MG PO TABS: 0.5000 | ORAL_TABLET | Freq: Three times a day (TID) | ORAL | 5 refills | Status: DC

## 2023-12-16 NOTE — Addendum Note (Signed)
 Addended by: Lenise Herald on: 12/16/2023 03:46 PM   Modules accepted: Orders

## 2023-12-16 NOTE — Patient Instructions (Signed)
 I saw you today for leg weakness, imbalance, and falls. This could be due to poor sensation in your legs from diabetes called neuropathy. It is also possible that this is a problem of movement, like Parkinson's disease.  I would like to investigate further with the following: -Blood work today -MRI brain   I will be in touch when I have your results.  I am also going to try a low dose of a parkinson's medication called Sinemet to see if this helps with your movement and balance.  I will see you back in clinic in 3 months to see your progress. Please let me know if you have any questions or concerns in the meantime.   The physicians and staff at [facility] are committed to providing excellent care. You may receive a survey requesting feedback about your experience at our office. We strive to receive "very good" responses to the survey questions. If you feel that your experience would prevent you from giving the office a "very good " response, please contact our office to try to remedy the situation. We may be reached at [phone]. Thank you for taking the time out of your busy day to complete the survey.  Jacquelyne Balint, MD [facility]  Preventing Falls at [facility] are common, often dreaded events in the lives of older people. Aside from the obvious injuries and even death that may result, fall can cause wide-ranging consequences including loss of independence, mental decline, decreased activity and mobility. Younger people are also at risk of falling, especially those with chronic illnesses and fatigue.  Ways to reduce risk for falling Examine diet and medications. Warm foods and alcohol dilate blood vessels, which can lead to dizziness when standing. Sleep aids, antidepressants and pain medications can also increase the likelihood of a fall.  Get a vision exam. Poor vision, cataracts and glaucoma increase the chances of falling.  Check foot gear. Shoes should fit snugly and  have a sturdy, nonskid sole and a broad, low heel  Participate in a physician-approved exercise program to build and maintain muscle strength and improve balance and coordination. Programs that use ankle weights or stretch bands are excellent for muscle-strengthening. Water aerobics programs and low-impact Tai Chi programs have also been shown to improve balance and coordination.  Increase vitamin D intake. Vitamin D improves muscle strength and increases the amount of calcium the body is able to absorb and deposit in bones.  How to prevent falls from common hazards Floors - Remove all loose wires, cords, and throw rugs. Minimize clutter. Make sure rugs are anchored and smooth. Keep furniture in its usual place.  Chairs -- Use chairs with straight backs, armrests and firm seats. Add firm cushions to existing pieces to add height.  Bathroom - Install grab bars and non-skid tape in the tub or shower. Use a bathtub transfer bench or a shower chair with a back support Use an elevated toilet seat and/or safety rails to assist standing from a low surface. Do not use towel racks or bathroom tissue holders to help you stand.  Lighting - Make sure halls, stairways, and entrances are well-lit. Install a night light in your bathroom or hallway. Make sure there is a light switch at the top and bottom of the staircase. Turn lights on if you get up in the middle of the night. Make sure lamps or light switches are within reach of the bed if you have to get up during the night.  Kitchen -  Install non-skid rubber mats near the sink and stove. Clean spills immediately. Store frequently used utensils, pots, pans between waist and eye level. This helps prevent reaching and bending. Sit when getting things out of lower cupboards.  Living room/ Bedrooms - Place furniture with wide spaces in between, giving enough room to move around. Establish a route through the living room that gives you something to hold onto as you  walk.  Stairs - Make sure treads, rails, and rugs are secure. Install a rail on both sides of the stairs. If stairs are a threat, it might be helpful to arrange most of your activities on the lower level to reduce the number of times you must climb the stairs.  Entrances and doorways - Install metal handles on the walls adjacent to the doorknobs of all doors to make it more secure as you travel through the doorway.  Tips for maintaining balance Keep at least one hand free at all times. Try using a backpack or fanny pack to hold things rather than carrying them in your hands. Never carry objects in both hands when walking as this interferes with keeping your balance.  Attempt to swing both arms from front to back while walking. This might require a conscious effort if Parkinson's disease has diminished your movement. It will, however, help you to maintain balance and posture, and reduce fatigue.  Consciously lift your feet off of the ground when walking. Shuffling and dragging of the feet is a common culprit in losing your balance.  When trying to navigate turns, use a "U" technique of facing forward and making a wide turn, rather than pivoting sharply.  Try to stand with your feet shoulder-length apart. When your feet are close together for any length of time, you increase your risk of losing your balance and falling.  Do one thing at a time. Don't try to walk and accomplish another task, such as reading or looking around. The decrease in your automatic reflexes complicates motor function, so the less distraction, the better.  Do not wear rubber or gripping soled shoes, they might "catch" on the floor and cause tripping.  Move slowly when changing positions. Use deliberate, concentrated movements and, if needed, use a grab bar or walking aid. Count 15 seconds between each movement. For example, when rising from a seated position, wait 15 seconds after standing to begin walking.  If balance is  a continuous problem, you might want to consider a walking aid such as a cane, walking stick, or walker. Once you've mastered walking with help, you might be ready to try it on your own again.

## 2023-12-22 ENCOUNTER — Telehealth: Payer: Self-pay

## 2023-12-22 ENCOUNTER — Encounter: Payer: Self-pay | Admitting: Neurology

## 2023-12-22 LAB — IMMUNOFIXATION ELECTROPHORESIS
IgM, Serum: 208 mg/dL (ref 50–300)
IgM, Serum: 1139 mg/dL (ref 600–300)
Immunoglobulin A: 1139 mg/dL (ref 70–320)
Immunoglobulin A: 132 mg/dL (ref 70–320)

## 2023-12-22 LAB — VITAMIN B1: Vitamin B1 (Thiamine): 10 nmol/L (ref 8–30)

## 2023-12-22 LAB — VITAMIN E
Gamma-Tocopherol (Vit E): <1 mg/L (ref <4.4)
Vitamin E (Alpha Tocopherol): 16.2 mg/L (ref 5.7–19.9)

## 2023-12-22 LAB — COPPER, SERUM: Copper: 125 ug/dL (ref 70–175)

## 2023-12-22 LAB — VITAMIN B12: Vitamin B-12: 459 pg/mL (ref 200–1100)

## 2023-12-22 NOTE — Telephone Encounter (Signed)
 Pt Aproved for MRI B with out contrast. # [id number]

## 2023-12-27 ENCOUNTER — Ambulatory Visit
Admission: RE | Admit: 2023-12-27 | Discharge: 2023-12-27 | Disposition: A | Discharge status: Home / Self Care | Source: Ambulatory Visit | Attending: Neurology | Admitting: Neurology

## 2023-12-27 DIAGNOSIS — G9389 Other specified disorders of brain: Secondary | ICD-10-CM | POA: Diagnosis not present

## 2023-12-27 DIAGNOSIS — R413 Other amnesia: Secondary | ICD-10-CM

## 2023-12-27 DIAGNOSIS — R2689 Other abnormalities of gait and mobility: Secondary | ICD-10-CM

## 2023-12-27 DIAGNOSIS — G629 Polyneuropathy, unspecified: Secondary | ICD-10-CM

## 2023-12-27 DIAGNOSIS — G20C Parkinsonism, unspecified: Secondary | ICD-10-CM

## 2023-12-28 ENCOUNTER — Other Ambulatory Visit: Payer: Self-pay | Admitting: Surgery

## 2023-12-28 DIAGNOSIS — K8020 Calculus of gallbladder without cholecystitis without obstruction: Secondary | ICD-10-CM | POA: Diagnosis not present

## 2024-01-11 ENCOUNTER — Telehealth: Payer: Self-pay | Admitting: Neurology

## 2024-01-11 NOTE — Telephone Encounter (Signed)
 Called and spoke with daughter. Patient is the same as prior. There has been no difference with 1/2 tablet of sinemet TID thus far. Patient is still having memory issues and imbalance with one fall in bathroom since last clinic visit. She does not have any significant urinary incontinence.  I discussed the results of the MRI brain. It showed significant atrophy. In my opinion, her ventriculomegaly is not out of proportion to atrophy, but concern was raised by radiology about NPH. She does have cognitive changes and gait abnormality, so clinically, this may be a consideration.  I discussed the options with patient's daughter: Increase Sinemet to 1 tablet TID to monitor for response Pursue lumbar puncture and PT evaluation of gait before and after for NPH Continue with current treatment and monitor  Daughter will discuss with patient and get back to me on any questions or what they would like to do.  All questions were answered.  Jacquelyne Balint, MD [facility]

## 2024-01-12 ENCOUNTER — Other Ambulatory Visit: Payer: Self-pay

## 2024-01-12 DIAGNOSIS — R2689 Other abnormalities of gait and mobility: Secondary | ICD-10-CM

## 2024-01-12 DIAGNOSIS — R209 Unspecified disturbances of skin sensation: Secondary | ICD-10-CM

## 2024-01-12 DIAGNOSIS — G629 Polyneuropathy, unspecified: Secondary | ICD-10-CM

## 2024-01-12 DIAGNOSIS — G20C Parkinsonism, unspecified: Secondary | ICD-10-CM

## 2024-01-13 ENCOUNTER — Telehealth: Payer: Self-pay | Admitting: Neurology

## 2024-01-13 ENCOUNTER — Telehealth: Payer: Self-pay

## 2024-01-13 NOTE — Telephone Encounter (Signed)
 Patients daughter Gunnar Fusi called and LM with AN. She would like to get her mother an appt for a spinal tap

## 2024-01-13 NOTE — Telephone Encounter (Signed)
 My Charted Brooke Morse and give her the scheduling number to get her LP done.

## 2024-01-13 NOTE — Telephone Encounter (Signed)
 Called Brooke Morse and let her know of the pt Lumbar Puncture and she has the orders. No need to call pt .

## 2024-01-14 ENCOUNTER — Other Ambulatory Visit: Payer: Self-pay

## 2024-01-17 ENCOUNTER — Other Ambulatory Visit: Payer: Self-pay

## 2024-01-17 ENCOUNTER — Ambulatory Visit (HOSPITAL_COMMUNITY)
Admission: RE | Admit: 2024-01-17 | Discharge: 2024-01-17 | Disposition: A | Discharge status: Home / Self Care | Source: Ambulatory Visit

## 2024-01-17 ENCOUNTER — Ambulatory Visit (HOSPITAL_COMMUNITY)
Admission: RE | Admit: 2024-01-17 | Discharge: 2024-01-17 | Disposition: A | Discharge status: Home / Self Care | Source: Ambulatory Visit | Attending: Primary Care | Admitting: Primary Care

## 2024-01-17 DIAGNOSIS — R531 Weakness: Secondary | ICD-10-CM | POA: Diagnosis not present

## 2024-01-17 DIAGNOSIS — R209 Unspecified disturbances of skin sensation: Secondary | ICD-10-CM | POA: Diagnosis not present

## 2024-01-17 DIAGNOSIS — G20C Parkinsonism, unspecified: Secondary | ICD-10-CM | POA: Insufficient documentation

## 2024-01-17 DIAGNOSIS — G629 Polyneuropathy, unspecified: Secondary | ICD-10-CM | POA: Diagnosis not present

## 2024-01-17 DIAGNOSIS — R413 Other amnesia: Secondary | ICD-10-CM | POA: Diagnosis not present

## 2024-01-17 DIAGNOSIS — R2689 Other abnormalities of gait and mobility: Secondary | ICD-10-CM | POA: Diagnosis not present

## 2024-01-17 LAB — GLUCOSE, CAPILLARY: Glucose-Capillary: 173 mg/dL — ABNORMAL HIGH (ref 70–99)

## 2024-01-17 LAB — CSF CELL COUNT WITH DIFFERENTIAL
RBC Count, CSF: 7 /mm3 — ABNORMAL HIGH
Tube #: 3
WBC, CSF: 1 /mm3 (ref 0–5)

## 2024-01-17 LAB — PROTEIN AND GLUCOSE, CSF
Glucose, CSF: 103 mg/dL — ABNORMAL HIGH (ref 40–70)
Total  Protein, CSF: 35 mg/dL (ref 15–45)

## 2024-01-17 MED ORDER — LIDOCAINE 1 % OPTIME INJ - NO CHARGE
5.0000 mL | Freq: Once | INTRAMUSCULAR | Status: AC
  Administered 2024-01-17: 3 mL via INTRADERMAL
  Filled 2024-01-17: qty 6

## 2024-01-17 NOTE — Procedures (Signed)
 PROCEDURE SUMMARY:  Successful fluoroscopic guided lumbar puncture at the level of L3-4.  Opening pressure was 15 cm H2O ~30 mL clear colorless fluid collected and sent for labs.  No immediate complications.  Pt tolerated well.   EBL = none  Please see full dictation in imaging section of Epic for procedure details.   Electronically Signed: Callan Yontz M Vaniyah Lansky, PA-C 01/17/2024, 11:01 AM

## 2024-01-18 DIAGNOSIS — E785 Hyperlipidemia, unspecified: Secondary | ICD-10-CM | POA: Diagnosis not present

## 2024-01-18 DIAGNOSIS — E10649 Type 1 diabetes mellitus with hypoglycemia without coma: Secondary | ICD-10-CM | POA: Diagnosis not present

## 2024-01-18 DIAGNOSIS — E1069 Type 1 diabetes mellitus with other specified complication: Secondary | ICD-10-CM | POA: Diagnosis not present

## 2024-01-18 LAB — HEMOGLOBIN A1C: Hemoglobin A1C: 9

## 2024-01-19 ENCOUNTER — Telehealth: Payer: Self-pay | Admitting: Neurology

## 2024-01-19 LAB — IGG CSF INDEX
Albumin CSF-mCnc: 25 mg/dL (ref 10–46)
Albumin: 3.9 g/dL (ref 3.8–4.8)
CSF IgG Index: 0.5 (ref 0.0–0.7)
IgG (Immunoglobin G), Serum: 1053 mg/dL (ref 586–1602)
IgG, CSF: 3.4 mg/dL (ref 0.0–6.7)
IgG/Alb Ratio, CSF: 0.14 (ref 0.00–0.25)

## 2024-01-19 LAB — CYTOLOGY - NON PAP

## 2024-01-19 NOTE — Telephone Encounter (Signed)
 Attempted to call to discuss results of lumbar puncture. Cytology is still in process but rest of CSF analysis was normal. PT noted no change in patient's gait or cognition after LP. NPH is unlikely. 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 I plan to discuss increasing Sinemet to 1 tablet three times daily.  I left a message and asked for a call back.  Rommie Coats, MD [facility]

## 2024-01-20 ENCOUNTER — Telehealth: Payer: Self-pay | Admitting: Neurology

## 2024-01-20 ENCOUNTER — Encounter: Payer: Self-pay | Admitting: Neurology

## 2024-01-20 DIAGNOSIS — G20C Parkinsonism, unspecified: Secondary | ICD-10-CM

## 2024-01-20 MED ORDER — CARBIDOPA-LEVODOPA 25-100 MG PO TABS: 1.0000 | ORAL_TABLET | Freq: Three times a day (TID) | ORAL | 5 refills | Status: DC

## 2024-01-20 NOTE — Telephone Encounter (Signed)
 I spoke to daughter about LP results. CSF analysis was normal. There was no significant change to gait or cognition after the LP compared to before per PT assessment and daughter's report. Her results are not consistent with NPH. 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 She is currently on Sinemet 1/2 tablet TID without significant change. I recommended increasing Sinemet to 1 tablet TID and monitoring response. Daughter agreed.  Patient will follow up with me as planned and call with any questions or concerns in the meantime.  All questions were answered.  Rommie Coats, MD [facility]

## 2024-01-28 DIAGNOSIS — K8020 Calculus of gallbladder without cholecystitis without obstruction: Secondary | ICD-10-CM | POA: Diagnosis not present

## 2024-02-10 ENCOUNTER — Encounter: Payer: Self-pay | Admitting: Primary Care

## 2024-02-10 ENCOUNTER — Telehealth: Payer: Self-pay

## 2024-02-10 NOTE — Telephone Encounter (Signed)
 Patient was identified as falling into the [place] Measure - Diabetes.   Patient was: Appointment scheduled with primary care provider in the next 30 days.    Patient followed by Endocrinology. I have abstracted her last A1C. She has follow up with Endo in 2 months.

## 2024-02-25 NOTE — Therapy (Signed)
 OUTPATIENT PHYSICAL THERAPY NEURO EVALUATION   Patient Name: Brooke Morse MRN: [id number] DOB:Aug 03, 1946, 78 y.o., female Today's Date: 02/29/2024   PCP: Gabriel John, NP   REFERRING PROVIDER: Ellene Gustin, MD  END OF SESSION:  PT End of Session - 02/29/24 1531     Visit Number 1    Number of Visits 17    Date for PT Re-Evaluation 04/29/24    Authorization Type [employer]    PT Start Time 1529    PT Stop Time 1610    PT Time Calculation (min) 41 min    Equipment Utilized During Treatment Gait belt    Activity Tolerance Patient tolerated treatment well    Behavior During Therapy WFL for tasks assessed/performed             Past Medical History:  Diagnosis Date   Diabetes mellitus without complication (HCC)    Hyperlipemia    Past Surgical History:  Procedure Laterality Date   bunion removal     COLONOSCOPY WITH PROPOFOL  N/A 03/18/2015   Procedure: COLONOSCOPY WITH PROPOFOL ;  Surgeon: Stephens Eis, MD;  Location: [facility] ENDOSCOPY;  Service: Gastroenterology;  Laterality: N/A;   Patient Active Problem List   Diagnosis Date Noted   Nausea and vomiting 10/22/2023   Episode of unresponsiveness 12/03/2022   Anxiety and depression 10/14/2022   Fatigue 05/13/2022   Microscopic hematuria 05/13/2022   Forgetfulness 05/13/2022   Shuffling gait 03/05/2022   Dizziness 03/05/2022   Environmental allergies 04/25/2019   Preventative health care 01/19/2018   Hyperlipidemia 11/30/2017   Type 1 diabetes (HCC) 11/30/2017   Essential hypertension 11/30/2017    ONSET DATE: 01/12/2024  REFERRING DIAG: R26.89 (ICD-10-CM) - Imbalance R20.9 (ICD-10-CM) - Disturbance of skin sensation G20.C (ICD-10-CM) - Parkinsonism, 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 Dr. Genita Keys has worked pt up for NPH that was negative and thinks diagnosis is likely Parkinsonism. Sees him again in June. Had a personal trainer until about a month ago, and pt's daughter reports her strength has gotten weaker. Came to the house 3 times a week for  an hour. Stopped until they further figured out what was going on. Has been taking Sinemet , 3 times a day, has been taking it more than a month. Has not noticed any changes. Also reports a change in her memory. Had a bad fall 3 days ago - was getting up and the chair went flying out from under her. Fell on the L side of her hip, no pain, just has a bad bruise. Has had 3 other falls, did not hurt herself.   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 Genita Keys: Cytology is still in process but rest of CSF analysis was normal. PT noted no change in patient's gait or cognition after LP. NPH is unlikely. 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 She is currently on Sinemet  1/2 tablet TID without significant change. I recommended increasing Sinemet  to 1 tablet TID and monitoring response.   Patient has problems with her balance. She thinks this has been going on about 1 year. She has to hold on to things when walking. She has fallen about 3 times since 10/06/23. She thinks it is due to leg weakness. She will usually will lower herself down due to the leg weakness. There seems to be core weakness too as she can have difficulty sitting up. She has been working with a Systems analyst 3 times per week for about 5 months and there has been little improvement. She  has a rollator that she uses 90% of the time.   PAIN:  Are you having pain? No "just feeling sore from the recent fall"   Vitals:   02/29/24 1545  BP: 123/74  Pulse: 66     PRECAUTIONS: Fall  FALLS: Has patient fallen in last 6 months? Yes. Number of falls 4  LIVING ENVIRONMENT: Lives with: lives with their daughter Lives in: House/apartment Stairs: No, house is 2 floors, but pt able to stay on the ground floor  Has following equipment at home: Single point cane, Walker - 4 wheeled, Shower bench, Grab bars, and has a raised toilet seat, has a Metallurgist   PLOF: Independent with household mobility with device, Needs assistance with ADLs, and Leisure: used to like to sew  Pt needs help getting dressed and putting on her shoes. Pt has stopped cooking   PATIENT GOALS: Wants to be able to walk on her own without assistance, walk without the rollator  OBJECTIVE:  Note: Objective measures were completed at Evaluation unless otherwise noted.  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   Mild chronic white matter disease.   No recent insult.  COGNITION: Overall cognitive status: Impaired   SENSATION: Light touch: Impaired  and pt unable to detect light touch on LLE when PT also touching RLE at the same time, pt able to detect with LLE with incr time when PT only touching LLE  Proprioception: WNL R ankle, got 2/3 correct on L ankle, could not detect ankle in DF   COORDINATION: RAM of BUE: dysmetric LUE  Heel to shin: slightly slower with LLE    POSTURE: rounded shoulders, forward head, posterior pelvic tilt, and weight shift right Sits in a somewhat windswept position with knees towards the L in manual clinic w/c    LOWER EXTREMITY MMT:    MMT Right Eval Left Eval  Hip flexion 3+ 3+  Hip extension    Hip  abduction 4 4  Hip adduction 4+ 4+  Hip  internal rotation    Hip external rotation    Knee flexion 4- 4  Knee extension 4 4+  Ankle dorsiflexion 4+ 5  Ankle plantarflexion    Ankle inversion    Ankle eversion    (Blank rows = not tested)  All tested in sitting   BED MOBILITY:  Pt uses a step to help get in due to bed being high, has a bed rail   TRANSFERS: Sit to stand: SBA  Assistive device utilized: with BUE support from chair, has rollator in front to stabilize      Stand to sit: SBA  Assistive device utilized: None     Pt performs with decr anterior lean and has feet too far forwards, making pt more reliant on BUE to stand. Needs reminder cues to press up from chair vs. Rollator.  Pt reports difficulty getting up from a lower surface. Has a lift recliner at home.     GAIT: Gait pattern: step through pattern, decreased stride length, decreased hip/knee flexion- Right, decreased hip/knee flexion- Left, decreased ankle dorsiflexion- Right, decreased ankle dorsiflexion- Left, Right foot flat, and Left foot flat Distance walked: Clinic distances  Assistive device utilized: Environmental consultant - 4 wheeled Level of assistance: SBA and CGA Comments: Needs cues for proper brake management before sitting back down to w/c    [facility] PT Assessment - 02/29/24 1601       Ambulation/Gait   Gait velocity 21.8 seconds = 1.5 ft/sec   with rollator     Standardized Balance Assessment   Standardized Balance Assessment Five Times Sit to Stand    Five times sit to stand comments  38.1 seconds with BUE support from chair and letting go in standing with pt feeling more off balance                                                                                                                                         TREATMENT DATE:  Self-Care: Sinemet  education and not taking with protein, education regarding Parkinsonism and responding to Sinemet     PATIENT EDUCATION: Education details: Clinical findings, POC, see Self-Care above Person  educated: Patient and Child(ren) Education method: Explanation Education comprehension: verbalized understanding and needs further education  HOME EXERCISE PROGRAM: Will provide at future session   GOALS: Goals reviewed with patient? Yes  SHORT TERM GOALS: Target date: 03/28/2024  Pt will be independent with initial HEP for strength, gait, balance in order to build upon functional gains made in therapy. Baseline: Goal status: INITIAL  2.  BERG to be assessed with STG/LTG written.  Baseline:  Goal status: INITIAL  3.  TUG to be assessed with LTG written.  Baseline:  Goal status: INITIAL  4.  Pt will improve gait speed with rollator/RW to at least 1.9 ft/sec in order to demo decr fall risk.  Baseline: 21.8 seconds = 1.5 ft/sec with rollator  Goal status: INITIAL  5.  Pt will improve 5x sit<>stand to less than or equal to 32 sec to demonstrate improved functional strength and transfer efficiency.  Baseline: 38.1 seconds with BUE support from chair  Goal status: INITIAL    LONG TERM GOALS: Target date: 04/25/2024  Pt will be independent with final HEP for strength, gait, balance in order to build upon functional gains made in therapy. Baseline:  Goal status: INITIAL  2.  BERG goal to be written.  Baseline:  Goal status: INITIAL  3.  TUG goal to be written.  Baseline:  Goal status: INITIAL  4.  Pt will improve gait speed with rollator/RW to at least 2.3 ft/sec in order to demo improved community mobility.  Baseline: 21.8 seconds = 1.5 ft/sec with rollator  Goal status: INITIAL  5.  Pt will improve 5x sit<>stand to less than or equal to 26 sec to demonstrate improved functional strength and transfer efficiency.  Baseline: 38.1 seconds with BUE support from chair  Goal status: INITIAL   ASSESSMENT:  CLINICAL IMPRESSION: Patient is a 78 year old female referred to Neuro OPPT for imbalance/Parkinsonism.  Per Dr. Genita Keys: CSF analysis was normal. PT noted no change in  patient's gait or cognition after LP. NPH is unlikely. 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 Pt has follow up with Dr. Genita Keys on 03/17/24. Pt reports no change in symptoms since starting to take Sinemet .  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 The following deficits were present during the exam: gait abnormalities, difficulties with transfers, impaired coordination, impaired sensation, decr strength, impaired balance, postural abnormalities. Based on gait speed and 5x sit <> stand, pt is an incr risk for falls. Will perform further balance assessments at next session. Pt would benefit from skilled PT to address these impairments and functional limitations to maximize functional mobility independence and decr fall risk.    OBJECTIVE IMPAIRMENTS: Abnormal gait, decreased activity tolerance, decreased balance, decreased cognition, decreased coordination, decreased endurance, decreased mobility, difficulty walking, decreased strength, impaired sensation, and postural dysfunction.   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 Genita Keys on 03/17/24 are also affecting patient's functional outcome.   REHAB POTENTIAL: Good  CLINICAL DECISION MAKING: Evolving/moderate complexity  EVALUATION COMPLEXITY: Moderate  PLAN:  PT FREQUENCY: 2x/week  PT DURATION: 8 weeks  PLANNED INTERVENTIONS: 97164- PT Re-evaluation, 97110-Therapeutic exercises, 97530- Therapeutic activity, 97112- Neuromuscular re-education, 97535- Self Care, 16109- Manual therapy, [id number]- Gait training,  Patient/Family education, Balance training, and DME instructions  PLAN FOR NEXT SESSION: assess BERG and TUG and write goals as appropriate. Initiate HEP for sit <> stands, BLE strength, and balance deficits. Work on sit <> stand training with tucking feet back and incr forward lean.    Seabron Cypress, PT, DPT 02/29/2024, 4:16 PM

## 2024-02-29 ENCOUNTER — Ambulatory Visit: Attending: Neurology | Admitting: Physical Therapy

## 2024-02-29 VITALS — BP 123/74 | HR 66

## 2024-02-29 DIAGNOSIS — Z9181 History of falling: Secondary | ICD-10-CM | POA: Insufficient documentation

## 2024-02-29 DIAGNOSIS — M6281 Muscle weakness (generalized): Secondary | ICD-10-CM | POA: Insufficient documentation

## 2024-02-29 DIAGNOSIS — R2689 Other abnormalities of gait and mobility: Secondary | ICD-10-CM | POA: Diagnosis not present

## 2024-02-29 DIAGNOSIS — G20C Parkinsonism, unspecified: Secondary | ICD-10-CM | POA: Insufficient documentation

## 2024-02-29 DIAGNOSIS — R2681 Unsteadiness on feet: Secondary | ICD-10-CM | POA: Insufficient documentation

## 2024-02-29 DIAGNOSIS — R209 Unspecified disturbances of skin sensation: Secondary | ICD-10-CM | POA: Insufficient documentation

## 2024-02-29 DIAGNOSIS — G629 Polyneuropathy, unspecified: Secondary | ICD-10-CM | POA: Insufficient documentation

## 2024-03-10 ENCOUNTER — Encounter: Payer: Self-pay | Admitting: Primary Care

## 2024-03-10 ENCOUNTER — Ambulatory Visit: Admitting: Physical Therapy

## 2024-03-10 ENCOUNTER — Telehealth (INDEPENDENT_AMBULATORY_CARE_PROVIDER_SITE_OTHER): Admitting: Primary Care

## 2024-03-10 DIAGNOSIS — R296 Repeated falls: Secondary | ICD-10-CM | POA: Diagnosis not present

## 2024-03-10 DIAGNOSIS — R2689 Other abnormalities of gait and mobility: Secondary | ICD-10-CM | POA: Diagnosis not present

## 2024-03-10 DIAGNOSIS — R2681 Unsteadiness on feet: Secondary | ICD-10-CM | POA: Diagnosis not present

## 2024-03-10 NOTE — Assessment & Plan Note (Signed)
 Agree that she would benefit from home health nursing and physical therapy. Referral placed.

## 2024-03-10 NOTE — Patient Instructions (Signed)
 You will receive a phone call regarding the home health nurse and physical therapy.  It was a pleasure to see you today!

## 2024-03-10 NOTE — Progress Notes (Signed)
 NEUROLOGY FOLLOW UP OFFICE NOTE  Brooke Morse [id number]  Subjective:  Brooke Morse is a 78 y.o. 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  To briefly review: 12/16/23: Patient has problems with her balance. She thinks this has been going on about 1 year. She has to hold on to things when walking. She has fallen about 3 times since 10/06/23. She thinks it is due to leg weakness. She will usually will lower herself down due to the leg weakness. There seems to be core weakness too as she can have difficulty sitting up. She has been working with a Systems analyst 3 times per week for about 5 months and there has been little improvement. She has a rollator that she uses 90% of the time. Daughter sees coordination as the biggest issue with using the rollator.    She denies numbness or tingling in legs or feet. She denies symptoms in her arms. She denies neck or back pain.   She has good sense of smell. She denies feeling frozen. Daughter does wonder though because when she loses her balance sometimes she does not take a step. She denies tremors. There is no concern for REM sleep disorder.   She does endorse some short term memory problems for which she was previously on memantine  5 mg BID. She stopped all of her medications except insulin for DM. She is able to take care of herself and handles her own finances. She lives with her daughter.   She sleeps about 5-6 hours per night. She takes a lot of naps during the day.  Per daughter her sleep hygiene could be better.   She has lost 10-15 lbs, mostly due to lack of appetite. She denies feeling depressed or down. She denies fevers or night sweats.    report any constitutional symptoms like fever, night sweats, anorexia or unintentional weight loss.   EtOH use: None  Restrictive diet? No Family history of neuropathy/myopathy/neurologic disease?  no  Most recent Assessment and Plan (12/16/23): Brooke Morse is a 78 y.o. female who presents for evaluation of leg weakness, imbalance, and falls. 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 Her neurological examination is pertinent for increased tone, diminished sensation in feet, decrementing finger tapping (RUE > LUE), shuffling steps, and en bloc turning. This is a complex case. 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 This can contribute to imbalance and falls, but her symptoms and difficulty walking appear out of proportion to her neuropathy. I am concerned for an overlapping process such as a parkinsonism. This is not currently clear though. I discussed with patient and her daughter including the work up and a trial of Sinemet  to monitor for response.   PLAN: -Blood work: B1, B12, IFE, copper , vit E -MRI brain wo contrast -Trial of Sinemet  1/2 tablet three times daily  Since their last visit: Per my phone note on 01/11/24: Called and spoke with daughter. Patient is the same as prior. There has been no difference with 1/2 tablet of sinemet  TID thus far. Patient is still having memory issues and imbalance with one fall in bathroom since last clinic visit. She does not have any significant urinary incontinence.   I discussed the results of the MRI brain. It showed significant atrophy. In my opinion, her ventriculomegaly is not out of  proportion to atrophy, but concern was raised by radiology about NPH. She does have cognitive changes and gait abnormality, so clinically, this may be a consideration.   I discussed the options with patient's daughter: Increase Sinemet  to 1 tablet TID to monitor for response Pursue lumbar puncture and PT evaluation of gait before and after for NPH Continue with current treatment and monitor   Daughter will discuss with patient and get back to me on any questions or what they would like to  do.  They decided to go forward with LP, which was normal. PT and patient noted no change in symptoms (gait or cognition) after LP. NPH is unlikely. I recommended patient increase Sinemet  to 1 tablet TID. Daughter is not sure this helped. She has been throwing up a lot due to gallbladder per daughter. Daughter is concerned that this is keeping her from keeping down medications, but this was only over the last couple of weeks.  Patient has been falling a lot. She states she feels like she has a swimming sensation then falls. She had to go to the ED on 03/15/24. She had been constipated and was weak over the prior week. CT of spine showed possible fracture at T12. She was given tramadol  and tylenol . She denies any significant pain now. She also thinks the dizziness has been better since ED visit.  She denies any significant pain today, including neck pain.    MEDICATIONS:  Outpatient Encounter Medications as of 03/17/2024  Medication Sig   ascorbic acid (VITAMIN C) 1000 MG tablet Take by mouth. (Patient not taking: Reported on 04/12/2023)   aspirin 81 MG tablet Take 81 mg by mouth. About 2 times a week (Patient not taking: Reported on 10/22/2023)   atorvastatin (LIPITOR) 20 MG tablet Take 20 mg by mouth. 2 a week (Patient not taking: Reported on 12/16/2023)   carbidopa -levodopa  (SINEMET  IR) 25-100 MG tablet Take 1 tablet by mouth 3 (three) times daily.   citalopram  (CELEXA ) 20 MG tablet Take 1 tablet (20 mg total) by mouth daily. for anxiety and depression. (Patient not taking: Reported on 04/12/2023)   Continuous Glucose Sensor (DEXCOM G7 SENSOR) MISC USE 1 EACH EVERY 10 DAYS   Fiber, Guar Gum, CHEW Chew 1 tablet by mouth daily.   insulin lispro (HUMALOG) 100 UNIT/ML KiwkPen Inject 12 Units as directed 3 (three) times daily.   Insulin Pen Needle (FIFTY50 PEN NEEDLES) 32G X 4 MM MISC USE FOUR TIMES A DAY AS DIRECTED.   Lancets (ONETOUCH DELICA PLUS LANCET33G) MISC USE TO TEST BLOOD SUGAR FOUR TIMES DAILY  AS INSTRUCTED   LANTUS SOLOSTAR 100 UNIT/ML Solostar Pen Inject 8 Units into the skin daily.   memantine  (NAMENDA ) 5 MG tablet Take 1 tablet (5 mg total) by mouth 2 (two) times daily. For memory (Patient not taking: Reported on 12/16/2023)   Multiple Vitamin (MULTIVITAMIN WITH MINERALS) TABS tablet Take 1 tablet by mouth daily. (Patient not taking: Reported on 12/16/2023)   Omega-3 Fatty Acids (OMEGA-3 1450 PO) Take by mouth. (Patient not taking: Reported on 10/22/2023)   ONETOUCH ULTRA test strip    traMADol  (ULTRAM ) 50 MG tablet 1-2 tablets every 6 hours as needed   No facility-administered encounter medications on file as of 03/17/2024.    PAST MEDICAL HISTORY: Past Medical History:  Diagnosis Date   Diabetes mellitus without complication (HCC)    Hyperlipemia     PAST SURGICAL HISTORY: Past Surgical History:  Procedure Laterality Date   bunion removal  COLONOSCOPY WITH PROPOFOL  N/A 03/18/2015   Procedure: COLONOSCOPY WITH PROPOFOL ;  Surgeon: Stephens Eis, MD;  Location: [facility] ENDOSCOPY;  Service: Gastroenterology;  Laterality: N/A;    ALLERGIES: Allergies  Allergen Reactions   Peanut-Containing Drug Products Itching    FAMILY HISTORY: Family History  Problem Relation Age of Onset   Colon cancer Father    Asthma Father     SOCIAL HISTORY: Social History   Tobacco Use   Smoking status: Former    Types: Cigarettes   Smokeless tobacco: Never   Tobacco comments:    Quit more than 20 years ago  Vaping Use   Vaping status: Never Used  Substance Use Topics   Alcohol use: No   Drug use: No   Social History   Social History Narrative   Divorced.   2 children, 5 grandchildren.   Retired. Once worked at a cigarette factory.   Enjoys sewing, cooking, walks 2 miles daily.    Are you right handed or left handed? Right   What is your current occupation?   Who lives with you? Daughter   What type of home do you live in: 1 story or 2 story? one          Objective:   Vital Signs:  BP 117/69   Pulse (!) 56   Ht 5' 7 (1.702 m)   Wt 142 lb (64.4 kg)   SpO2 98%   BMI 22.24 kg/m   General: General appearance: Awake and alert. No distress. Cooperative with exam.  Skin: No obvious rash or jaundice. HEENT: Atraumatic. Anicteric. Lungs: Non-labored breathing on room air  Extremities: No edema. Psych: Affect appropriate.  Neurological: Mental Status: Alert. Speech fluent. No pseudobulbar affect Cranial Nerves: CNII: No RAPD. Visual fields intact. CNIII, IV, VI: PERRL. No nystagmus. EOMI. CN V: Facial sensation intact bilaterally to fine touch. CN VII: Facial muscles symmetric and strong. No ptosis at rest. CN VIII: Hears finger rub well bilaterally. CN IX: No hypophonia. CN X: Palate elevates symmetrically. CN XI: Full strength shoulder shrug bilaterally. CN XII: Tongue protrusion full and midline. No atrophy or fasciculations. No significant dysarthria Motor: Tone is increased in all extremities.  Individual muscle group testing (MRC grade out of 5):  Movement     Neck flexion 5    Neck extension 5     Right Left   Shoulder abduction 5 5   Elbow flexion 5 5   Elbow extension 5 5   Finger extension 5 5   Finger flexion 5 5    Hip flexion 4+ 4+   Hip extension 5 5   Hip adduction 5 5   Hip abduction 5 5   Knee extension 5 5   Knee flexion 5 5   Dorsiflexion 5 5   Plantarflexion 5 5    Reflexes:  Right Left   Bicep 2+ 2+   Tricep 2+ 2+   BrRad 2+ 2+   Knee 2+ 2+   Ankle 0 0    Sensation: Pinprick: Diminished in bilateral feet, intact above ankles Vibration: Diminished in bilateral great toes, otherwise intact Proprioception: Absent at bilateral great toes Coordination: Intact finger-to- nose-finger bilaterally. Finger tapping equivocal today. Gait: Unable to rise from chair with arms crossed unassisted. Walks with walker. Very unsteady. Slow, shuffling steps. Could not turn today.   Labs and Imaging review: New  results: 12/16/23: Copper  wnl Vit E wnl B1 wnl B12: 459 IFE no M protein  CSF (01/17/24): opening pressure 15  cm of H2O 7 RBC, 1 WBC, 35 protein, 103 glucose IgG index wnl Cytology wnl  HbA1c (01/18/24): 9  MRI brain wo contrast (12/27/23): FINDINGS: Brain: Ventriculomegaly which is generalized with callosal angle narrowing and disproportionate subarachnoid spaces on coronal T2 weighted imaging. There is a degree of brain atrophy especially of the periatrial white matter with ventricular scalloping. FLAIR hyperintensity in the cerebral white matter, usually related to chronic small vessel ischemia. No cortical infarct, mass, or collection. Hypointense nodule along the left occipital horn which may be from remote hemorrhage or mineralization, nonspecific in isolation. Normal appearance of the cerebellum and brainstem.   Vascular: Major flow voids are preserved   Skull and upper cervical spine: Normal marrow signal.   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   Mild chronic white matter disease.   No recent insult.  Previously reviewed results: Hepatic function panel (11/23/23) unremarkable   10/22/23: CMP significant for glucose 321, AST 46, ALT 114   HbA1c (09/16/23): 8.7 B12 (05/19/22): 239   External labs: 05/31/23: TSH wnl Lipid panel: tChol 241, LDL 148, TG 77 HbA1c: 9   Imaging/Procedures: Lumbar spine xray (06/01/2007): IMPRESSION:  1. No fracture is seen.  2. There is a mild spondylolisthesis without spondylolysis at the L4-L5  level where there is also observed narrowing of the disc space compatible  disc disease. This could be further evaluated by MR if clinically  indicated.  2. There is a nonspecific calcific appearing density superior to the L4  lumbar transverse process on the RIGHT. As noted above this likely  represents a phlebolith but  the possibility of a RIGHT ureteral stone  cannot  be entirely excluded from the differential and if such is a clinical  concern, CT examination utilizing stone protocol would be recommended.   Assessment/Plan:  This is Brooke Morse, a 78 y.o. female with imbalance and falls. Her examination is pertinent for increased tone and shuffling gait. She is very unsteady, even with a walker. 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 I am concerned for parkinsonism. Her MRI brain showed ventriculomegaly but LP did not produce improvement as would be expected with NPH. She has not had clear response to Sinemet , but has had GI problems including vomiting and constipation that may complicating that response. Other possibility that should be evaluated is is cervical stenosis.  Plan: -Agree with home PT through home health as planned -Continue Sinemet  1 tablet TID -MRI cervical wo contrast -Fall precautions discussed. Recommended wheelchair if needed to prevent falls.  Return to clinic in 1 month  Total time spent reviewing records, interview, history/exam, documentation, and coordination of care on day of encounter:  45 min  Rommie Coats, MD

## 2024-03-10 NOTE — Progress Notes (Signed)
 Patient ID: Brooke Morse, female    DOB: 08-25-1946, 78 y.o.   MRN: [id number]  Virtual visit completed through caregility, a video enabled telemedicine application. Due to national recommendations of social distancing due to COVID-19, a virtual visit is felt to be most appropriate for this patient at this time. Reviewed limitations, risks, security and privacy concerns of performing a virtual visit and the availability of in person appointments. I also reviewed that there may be a patient responsible charge related to this service. The patient agreed to proceed.   Patient location: home Provider location: [place] at [facility], office Persons participating in this virtual visit: patient, provider, patient's daughter   If any vitals were documented, they were collected by patient at home unless specified below.    There were no vitals taken for this visit.   CC: Home Health  Subjective:   HPI: Brooke Morse is a 78 y.o. female with a history of potential Parkinson's disease, type 1 diabetes, hypertension, altered gait, recurrent falls presenting on 03/10/2024 for Home Health (Increase in falls within the past 7 days/Discuss setting up [facility] services)  Chronic history of gradual decline in overall mobility and function over the last 1-2 years.  Currently following with neurology who is suspecting Parkinson's disease as a diagnosis.  She does have known neuropathy secondary to diabetes.  Over the last year she has sustained several falls due to imbalance.  She was working with a private physical therapist for several months which helped with overall balance and strength.  Upon discontinuation of the services her function declined.  Her daughter is with her today requesting home health services for physical therapy and nursing.    She is needing a nurse aide to help with ADLs such as bathing, getting out of bed and dressed, toilet assistance. She does use her Rolator walker. She has been prescribed  neuro physical therapy in the outpatient setting.   She is needing physical therapy to assist with balance, strength, prevention of falls. She is falling numerous times weekly, has fallen 3 times this week thus far. No injuries from recent falls.       Relevant past medical, surgical, family and social history reviewed and updated as indicated. Interim medical history since our last visit reviewed. Allergies and medications reviewed and updated. Outpatient Medications Prior to Visit  Medication Sig Dispense Refill   carbidopa -levodopa  (SINEMET  IR) 25-100 MG tablet Take 1 tablet by mouth 3 (three) times daily. 90 tablet 5   Continuous Glucose Sensor (DEXCOM G7 SENSOR) MISC USE 1 EACH EVERY 10 DAYS     Fiber, Guar Gum, CHEW Chew 1 tablet by mouth daily.     insulin lispro (HUMALOG) 100 UNIT/ML KiwkPen Inject 12 Units as directed 3 (three) times daily.     Insulin Pen Needle (FIFTY50 PEN NEEDLES) 32G X 4 MM MISC USE FOUR TIMES A DAY AS DIRECTED.     Lancets (ONETOUCH DELICA PLUS LANCET33G) MISC USE TO TEST BLOOD SUGAR FOUR TIMES DAILY AS INSTRUCTED     LANTUS SOLOSTAR 100 UNIT/ML Solostar Pen Inject 8 Units into the skin daily.     ONETOUCH ULTRA test strip      ascorbic acid (VITAMIN C) 1000 MG tablet Take by mouth. (Patient not taking: Reported on 04/12/2023)     aspirin 81 MG tablet Take 81 mg by mouth. About 2 times a week (Patient not taking: Reported on 10/22/2023)     atorvastatin (LIPITOR) 20 MG tablet Take 20  mg by mouth. 2 a week (Patient not taking: Reported on 12/16/2023)     citalopram  (CELEXA ) 20 MG tablet Take 1 tablet (20 mg total) by mouth daily. for anxiety and depression. (Patient not taking: Reported on 04/12/2023) 90 tablet 0   memantine  (NAMENDA ) 5 MG tablet Take 1 tablet (5 mg total) by mouth 2 (two) times daily. For memory (Patient not taking: Reported on 12/16/2023) 180 tablet 0   Multiple Vitamin (MULTIVITAMIN WITH MINERALS) TABS tablet Take 1 tablet by mouth daily. (Patient  not taking: Reported on 12/16/2023)     Omega-3 Fatty Acids (OMEGA-3 1450 PO) Take by mouth. (Patient not taking: Reported on 10/22/2023)     No facility-administered medications prior to visit.     Per HPI unless specifically indicated in ROS section below Review of Systems  Musculoskeletal:  Positive for arthralgias and gait problem.  Neurological:  Positive for weakness. Negative for dizziness.   Objective:  There were no vitals taken for this visit.  Wt Readings from Last 3 Encounters:  01/17/24 140 lb (63.5 kg)  12/16/23 151 lb (68.5 kg)  10/22/23 151 lb (68.5 kg)       Physical exam: General: Alert and oriented x 3, no distress, does not appear sickly  Pulmonary: Speaks in complete sentences without increased work of breathing, no cough during visit.  Psychiatric: Normal mood, thought content, and behavior.     Results for orders placed or performed in visit on 02/10/24  Microalbumin / creatinine urine ratio   Collection Time: 05/31/23 12:00 AM  Result Value Ref Range   Microalb Creat Ratio 10.9   Protein / creatinine ratio, urine   Collection Time: 05/31/23 12:00 AM  Result Value Ref Range   Creatinine, Urine 109.9    Albumin, U 12    Assessment & Plan:   Problem List Items Addressed This Visit       Other   Imbalance - Primary   Relevant Orders   Ambulatory referral to Home Health   Unsteady gait   Causing recurrent falls. Continue using walker.  Referral placed for home health nursing and physical therapy.      Relevant Orders   Ambulatory referral to Home Health   Recurrent falls   Agree that she would benefit from home health nursing and physical therapy. Referral placed.      Relevant Orders   Ambulatory referral to Home Health     No orders of the defined types were placed in this encounter.  Orders Placed This Encounter  Procedures   Ambulatory referral to Home Health    Referral Priority:   Routine    Referral Type:   Home Health  Care    Referral Reason:   Specialty Services Required    Requested Specialty:   Home Health Services    Number of Visits Requested:   1    I discussed the assessment and treatment plan with the patient. The patient was provided an opportunity to ask questions and all were answered. The patient agreed with the plan and demonstrated an understanding of the instructions. The patient was advised to call back or seek an in-person evaluation if the symptoms worsen or if the condition fails to improve as anticipated.  Follow up plan:  You will receive a phone call regarding the home health nurse and physical therapy.  It was a pleasure to see you today!   Evi Mccomb K Naleyah Ohlinger, NP

## 2024-03-10 NOTE — Assessment & Plan Note (Signed)
 Causing recurrent falls. Continue using walker.  Referral placed for home health nursing and physical therapy.

## 2024-03-13 ENCOUNTER — Encounter: Payer: Self-pay | Admitting: Physical Therapy

## 2024-03-13 NOTE — Therapy (Signed)
 [facility] [address] [place], [place], [address] Phone: [phone]   Fax:  [phone]  Patient Details  Name: Brooke Morse MRN: [id number] Date of Birth: [birth year] Referring Provider:  No ref. provider found  Encounter Date: 03/13/2024   PHYSICAL THERAPY DISCHARGE SUMMARY  Visits from Start of Care: Eval only  Pt recently had a tele-health visit with PCP regarding a decline in function and having more falls since last here for PT eval. Pt's PCP put in order for HHPT and nursing. PT called pt's daughter and explained that since pt receiving HHPT services and has had a decline, she can't also receive outpatient services. Called pt's daughter regarding this that pt would need to be discharged from OPPT in order to receive home health. Discussed that in the future pt can get a new referral to return. Pt's daughter verbalized understanding of instructions and in agreement.   Seabron Cypress, PT, DPT 03/13/2024, 12:01 PM  [facility] [address] [place], [place], [address] Phone: [phone]   Fax:  [phone]

## 2024-03-14 ENCOUNTER — Ambulatory Visit: Admitting: Physical Therapy

## 2024-03-15 ENCOUNTER — Emergency Department (HOSPITAL_COMMUNITY)

## 2024-03-15 ENCOUNTER — Encounter (HOSPITAL_COMMUNITY): Payer: Self-pay

## 2024-03-15 ENCOUNTER — Other Ambulatory Visit: Payer: Self-pay

## 2024-03-15 ENCOUNTER — Emergency Department (HOSPITAL_COMMUNITY)
Admission: EM | Admit: 2024-03-15 | Discharge: 2024-03-15 | Disposition: A | Discharge status: Home / Self Care | Attending: Emergency Medicine | Admitting: Emergency Medicine

## 2024-03-15 DIAGNOSIS — G20C Parkinsonism, unspecified: Secondary | ICD-10-CM | POA: Insufficient documentation

## 2024-03-15 DIAGNOSIS — K5900 Constipation, unspecified: Secondary | ICD-10-CM

## 2024-03-15 DIAGNOSIS — W1811XA Fall from or off toilet without subsequent striking against object, initial encounter: Secondary | ICD-10-CM | POA: Diagnosis not present

## 2024-03-15 DIAGNOSIS — R112 Nausea with vomiting, unspecified: Secondary | ICD-10-CM | POA: Insufficient documentation

## 2024-03-15 DIAGNOSIS — R29818 Other symptoms and signs involving the nervous system: Secondary | ICD-10-CM | POA: Diagnosis not present

## 2024-03-15 DIAGNOSIS — R531 Weakness: Secondary | ICD-10-CM | POA: Insufficient documentation

## 2024-03-15 DIAGNOSIS — R079 Chest pain, unspecified: Secondary | ICD-10-CM | POA: Diagnosis not present

## 2024-03-15 DIAGNOSIS — M545 Low back pain, unspecified: Secondary | ICD-10-CM | POA: Diagnosis not present

## 2024-03-15 DIAGNOSIS — E1165 Type 2 diabetes mellitus with hyperglycemia: Secondary | ICD-10-CM | POA: Insufficient documentation

## 2024-03-15 DIAGNOSIS — Z7982 Long term (current) use of aspirin: Secondary | ICD-10-CM | POA: Insufficient documentation

## 2024-03-15 DIAGNOSIS — S22080A Wedge compression fracture of T11-T12 vertebra, initial encounter for closed fracture: Secondary | ICD-10-CM | POA: Insufficient documentation

## 2024-03-15 DIAGNOSIS — Z91010 Allergy to peanuts: Secondary | ICD-10-CM | POA: Diagnosis not present

## 2024-03-15 DIAGNOSIS — Z794 Long term (current) use of insulin: Secondary | ICD-10-CM | POA: Insufficient documentation

## 2024-03-15 DIAGNOSIS — K8689 Other specified diseases of pancreas: Secondary | ICD-10-CM | POA: Diagnosis not present

## 2024-03-15 DIAGNOSIS — I6782 Cerebral ischemia: Secondary | ICD-10-CM | POA: Diagnosis not present

## 2024-03-15 DIAGNOSIS — D259 Leiomyoma of uterus, unspecified: Secondary | ICD-10-CM | POA: Diagnosis not present

## 2024-03-15 DIAGNOSIS — S22000A Wedge compression fracture of unspecified thoracic vertebra, initial encounter for closed fracture: Secondary | ICD-10-CM

## 2024-03-15 DIAGNOSIS — N200 Calculus of kidney: Secondary | ICD-10-CM | POA: Diagnosis not present

## 2024-03-15 DIAGNOSIS — M47816 Spondylosis without myelopathy or radiculopathy, lumbar region: Secondary | ICD-10-CM | POA: Diagnosis not present

## 2024-03-15 LAB — COMPREHENSIVE METABOLIC PANEL WITH GFR
ALT: 10 U/L (ref 0–44)
AST: 17 U/L (ref 15–41)
Albumin: 3.2 g/dL — ABNORMAL LOW (ref 3.5–5.0)
Alkaline Phosphatase: 74 U/L (ref 38–126)
Anion gap: 9 (ref 5–15)
BUN: 7 mg/dL — ABNORMAL LOW (ref 8–23)
CO2: 25 mmol/L (ref 22–32)
Calcium: 9.3 mg/dL (ref 8.9–10.3)
Chloride: 103 mmol/L (ref 98–111)
Creatinine, Ser: 0.66 mg/dL (ref 0.44–1.00)
GFR, Estimated: >60 mL/min (ref >60)
Glucose, Bld: 170 mg/dL — ABNORMAL HIGH (ref 70–99)
Potassium: 4.1 mmol/L (ref 3.5–5.1)
Sodium: 137 mmol/L (ref 135–145)
Total Bilirubin: 0.6 mg/dL (ref 0.0–1.2)
Total Protein: 6.7 g/dL (ref 6.5–8.1)

## 2024-03-15 LAB — URINALYSIS, ROUTINE W REFLEX MICROSCOPIC
Bilirubin Urine: NEGATIVE
Glucose, UA: NEGATIVE mg/dL
Hgb urine dipstick: NEGATIVE
Ketones, ur: NEGATIVE mg/dL
Leukocytes,Ua: NEGATIVE
Nitrite: NEGATIVE
Protein, ur: NEGATIVE mg/dL
Specific Gravity, Urine: 1.006 (ref 1.005–1.030)
pH: 5 (ref 5.0–8.0)

## 2024-03-15 LAB — CBC
HCT: 42.5 % (ref 36.0–46.0)
Hemoglobin: 13.7 g/dL (ref 12.0–15.0)
MCH: 26.6 pg (ref 26.0–34.0)
MCHC: 32.2 g/dL (ref 30.0–36.0)
MCV: 82.4 fL (ref 80.0–100.0)
Platelets: 288 10*3/uL (ref 150–400)
RBC: 5.16 MIL/uL — ABNORMAL HIGH (ref 3.87–5.11)
RDW: 13.4 % (ref 11.5–15.5)
WBC: 5.3 10*3/uL (ref 4.0–10.5)
nRBC: 0 % (ref 0.0–0.2)

## 2024-03-15 LAB — TROPONIN I (HIGH SENSITIVITY): Troponin I (High Sensitivity): 5 ng/L (ref <18)

## 2024-03-15 LAB — MAGNESIUM: Magnesium: 1.9 mg/dL (ref 1.7–2.4)

## 2024-03-15 LAB — LACTIC ACID, PLASMA: Lactic Acid, Venous: 1.3 mmol/L (ref 0.5–1.9)

## 2024-03-15 LAB — CBG MONITORING, ED: Glucose-Capillary: 189 mg/dL — ABNORMAL HIGH (ref 70–99)

## 2024-03-15 MED ORDER — ACETAMINOPHEN 500 MG PO TABS
1000.0000 mg | ORAL_TABLET | Freq: Once | ORAL | Status: AC
  Administered 2024-03-15: 1000 mg via ORAL
  Filled 2024-03-15: qty 2

## 2024-03-15 MED ORDER — IOHEXOL 350 MG/ML SOLN
75.0000 mL | Freq: Once | INTRAVENOUS | Status: AC | PRN
  Administered 2024-03-15: 75 mL via INTRAVENOUS

## 2024-03-15 MED ORDER — FENTANYL CITRATE PF 50 MCG/ML IJ SOSY
50.0000 ug | PREFILLED_SYRINGE | Freq: Once | INTRAMUSCULAR | Status: AC
  Administered 2024-03-15: 50 ug via INTRAVENOUS
  Filled 2024-03-15: qty 1

## 2024-03-15 MED ORDER — TRAMADOL HCL 50 MG PO TABS
50.0000 mg | ORAL_TABLET | Freq: Once | ORAL | Status: AC
  Administered 2024-03-15: 50 mg via ORAL
  Filled 2024-03-15: qty 1

## 2024-03-15 MED ORDER — TRAMADOL HCL 50 MG PO TABS: ORAL_TABLET | ORAL | 0 refills | Status: DC

## 2024-03-15 MED ORDER — IOHEXOL 350 MG/ML SOLN
65.0000 mL | Freq: Once | INTRAVENOUS | Status: AC | PRN
  Administered 2024-03-15: 65 mL via INTRAVENOUS

## 2024-03-15 NOTE — ED Provider Notes (Signed)
 [place] EMERGENCY DEPARTMENT AT [facility] Provider Note   CSN: [id number] Arrival date & time: 03/15/24  1118     History  Chief Complaint  Patient presents with   Weakness    Brooke Morse is a 78 y.o. female.   Weakness Associated symptoms: dizziness, nausea and vomiting      78 year old female with medical history significant for diabetes mellitus, hyperlipidemia presenting to the emergency department with multiple complaints.  The patient states that for the last week she has had no bowel movement.  She has had associated nonbloody and nonbilious vomiting with associated nausea.  She is not passing gas.  She also has fallen 4 times in the past week.  She states that the etiology of each fall has been room spinning dizziness that comes on suddenly when she sits down to use the bathroom.  This then causes her to lose her balance on the toilet and fall forward.  Symptoms resolve after sitting on the ground for several minutes.  She denies any persistent room spinning dizziness.  She denies any current neurologic deficits, no room spinning dizziness, no facial droop, no difficulty swallowing or speaking, no focal weakness in the arms or legs and no numbness.  She does feel bilaterally weak in the lower extremity.  After one of her falls, she landed right on her low back and has been having sharp low back pain and endorses bilateral weakness in the lower extremities.  No urinary or fecal incontinence, denies any vision changes or vision loss.  She denies any neck pain.  She was recently diagnosed with parkinsonism 4 months ago.    Home Medications Prior to Admission medications   Medication Sig Start Date End Date Taking? Authorizing Provider  ascorbic acid (VITAMIN C) 1000 MG tablet Take by mouth. Patient not taking: Reported on 04/12/2023    [provider]  aspirin 81 MG tablet Take 81 mg by mouth. About 2 times a week Patient not taking: Reported on 10/22/2023     [provider]  atorvastatin (LIPITOR) 20 MG tablet Take 20 mg by mouth. 2 a week Patient not taking: Reported on 12/16/2023    [provider]  carbidopa -levodopa  (SINEMET  IR) 25-100 MG tablet Take 1 tablet by mouth 3 (three) times daily. 01/20/24   Ellene Gustin, MD  citalopram  (CELEXA ) 20 MG tablet Take 1 tablet (20 mg total) by mouth daily. for anxiety and depression. Patient not taking: Reported on 04/12/2023 10/22/22   Clark, Katherine K, NP  Continuous Glucose Sensor (DEXCOM G7 SENSOR) MISC USE 1 EACH EVERY 10 DAYS    [provider]  Fiber, Guar Gum, CHEW Chew 1 tablet by mouth daily.    [provider]  insulin lispro (HUMALOG) 100 UNIT/ML KiwkPen Inject 12 Units as directed 3 (three) times daily. 11/05/17   [provider]  Insulin Pen Needle (FIFTY50 PEN NEEDLES) 32G X 4 MM MISC USE FOUR TIMES A DAY AS DIRECTED. 11/09/18   [provider]  Lancets (ONETOUCH DELICA PLUS LANCET33G) MISC USE TO TEST BLOOD SUGAR FOUR TIMES DAILY AS INSTRUCTED 11/10/18   [provider]  LANTUS SOLOSTAR 100 UNIT/ML Solostar Pen Inject 8 Units into the skin daily.    [provider]  memantine  (NAMENDA ) 5 MG tablet Take 1 tablet (5 mg total) by mouth 2 (two) times daily. For memory Patient not taking: Reported on 12/16/2023 08/06/23   Clark, Katherine K, NP  Multiple Vitamin (MULTIVITAMIN WITH MINERALS) TABS  tablet Take 1 tablet by mouth daily. Patient not taking: Reported on 12/16/2023    [provider]  Omega-3 Fatty Acids (OMEGA-3 1450 PO) Take by mouth. Patient not taking: Reported on 10/22/2023    [provider]  [facility] ULTRA test strip  02/18/19   [provider]      Allergies    Peanut-containing drug products    Review of Systems   Review of Systems  Gastrointestinal:  Positive for constipation, nausea and vomiting.  Musculoskeletal:  Positive for back pain.  Neurological:  Positive for dizziness and  weakness. Negative for syncope, facial asymmetry, speech difficulty and numbness.  All other systems reviewed and are negative.   Physical Exam Updated Vital Signs BP 134/81 (BP Location: Right Arm)   Pulse 69   Temp 98 F (36.7 C)   Resp 12   Ht 5' 7 (1.702 m)   Wt 63.5 kg   SpO2 98%   BMI 21.93 kg/m  Physical Exam Vitals and nursing note reviewed.  Constitutional:      General: She is not in acute distress.    Appearance: She is well-developed.     Comments: GCS 15, ABC intact  HENT:     Head: Normocephalic and atraumatic.  Eyes:     Extraocular Movements: Extraocular movements intact.     Conjunctiva/sclera: Conjunctivae normal.     Pupils: Pupils are equal, round, and reactive to light.  Neck:     Comments: No midline tenderness to palpation of the cervical spine.  Range of motion intact Cardiovascular:     Rate and Rhythm: Normal rate and regular rhythm.     Heart sounds: No murmur heard. Pulmonary:     Effort: Pulmonary effort is normal. No respiratory distress.     Breath sounds: Normal breath sounds.  Chest:     Comments: Clavicles stable nontender to AP compression.  Chest wall stable and nontender to AP and lateral compression. Abdominal:     Palpations: Abdomen is soft.     Tenderness: There is no abdominal tenderness. There is no guarding.     Comments: Pelvis stable to lateral compression  Musculoskeletal:        General: No swelling.     Cervical back: Neck supple.     Comments: Midline tenderness to palpation of the lower thoracic/upper lumbar spine extremities atraumatic with intact range of motion  Skin:    General: Skin is warm and dry.     Capillary Refill: Capillary refill takes less than 2 seconds.  Neurological:     Mental Status: She is alert.     Comments: MENTAL STATUS EXAM:    Orientation: Alert and oriented to person, place and time.  Memory: Cooperative, follows commands well.  Language: Speech is clear and language is normal.    CRANIAL NERVES:    CN 2 (Optic): Visual fields intact to confrontation.  CN 3,4,6 (EOM): Pupils equal and reactive to light. Full extraocular eye movement without nystagmus.  CN 5 (Trigeminal): Facial sensation is normal, no weakness of masticatory muscles.  CN 7 (Facial): No facial weakness or asymmetry.  CN 8 (Auditory): Auditory acuity grossly normal.  CN 9,10 (Glossophar): The uvula is midline, the palate elevates symmetrically.  CN 11 (spinal access): Normal sternocleidomastoid and trapezius strength.  CN 12 (Hypoglossal): The tongue is midline. No atrophy or fasciculations.Aaron Aas   MOTOR:  Muscle Strength: 5/5RUE, 5/5LUE, 5/5RLE, 5/5LLE.   COORDINATION:   Intact finger-to-nose, no tremor.   SENSATION:  Intact to light touch all four extremities.  GAIT: Gait not assessed   Psychiatric:        Mood and Affect: Mood normal.     ED Results / Procedures / Treatments   Labs (all labs ordered are listed, but only abnormal results are displayed) Labs Reviewed  COMPREHENSIVE METABOLIC PANEL WITH GFR - Abnormal; Notable for the following components:      Result Value   Glucose, Bld 170 (*)    BUN 7 (*)    Albumin 3.2 (*)    All other components within normal limits  CBC - Abnormal; Notable for the following components:   RBC 5.16 (*)    All other components within normal limits  CBG MONITORING, ED - Abnormal; Notable for the following components:   Glucose-Capillary 189 (*)    All other components within normal limits  URINALYSIS, ROUTINE W REFLEX MICROSCOPIC    EKG None  Radiology No results found.  Procedures Procedures    Medications Ordered in ED Medications - No data to display  ED Course/ Medical Decision Making/ A&P                                 Medical Decision Making Amount and/or Complexity of Data Reviewed Labs: ordered. Radiology: ordered.  Risk Prescription drug management.     78 year old female with medical history significant for  diabetes mellitus, hyperlipidemia presenting to the emergency department with multiple complaints.  The patient states that for the last week she has had no bowel movement.  She has had associated nonbloody and nonbilious vomiting with associated nausea.  She is not passing gas.  She also has fallen 4 times in the past week.  She states that the etiology of each fall has been room spinning dizziness that comes on suddenly when she sits down to use the bathroom.  This then causes her to lose her balance on the toilet and fall forward.  Symptoms resolve after sitting on the ground for several minutes.  She denies any persistent room spinning dizziness.  She denies any current neurologic deficits, no room spinning dizziness, no facial droop, no difficulty swallowing or speaking, no focal weakness in the arms or legs and no numbness.  She does feel bilaterally weak in the lower extremity.  After one of her falls, she landed right on her low back and has been having sharp low back pain and endorses bilateral weakness in the lower extremities.  No urinary or fecal incontinence, denies any vision changes or vision loss.  She denies any neck pain.  She was recently diagnosed with parkinsonism 4 months ago.    On arrival, the patient was afebrile, temperature 98, heart rate 69, respiratory rate 12, BP 134/81, saturating 98% on room air.  Patient exam concerning for thoracic tenderness and the patient is having pain.  She has had 4 falls attributed to positional vertigo over the past week.  Would have room spinning dizziness upon sitting down at the toilet.  Symptoms would improve with rest on the ground after several minutes.  Currently denies any dizziness at this time and is neurologically intact.  She does feel like her legs are slightly weaker but had intact strength on my exam.  Additionally, the patient has been having difficulty with abdominal discomfort and vomiting and has not had a bowel movement in the past  week.  Differential includes constipation, bowel obstruction, other acute intra-abdominal  emergency.  CT imaging of of the head was obtained in the setting the patient's intermittent vertigo for which she is asymptomatic at this time with a normal neurologic exam.  CT imaging was negative for acute findings. IMPRESSION:  No CT evidence of acute intracranial abnormality.    Similar ventriculomegaly which may reflect normal pressure  hydrocephalus in the appropriate clinical context.    Chronic microvascular ischemic changes.    CT abdomen pelvis with contrast was performed: IMPRESSION:  Nonobstructive left renal calculus.    Multiple uterine fibroids are noted.    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 No ductal  dilatation or acute pancreatic inflammation is noted.    Aortic Atherosclerosis (ICD10-I70.0).    Labs: CBG 189, magnesium normal, lactic acid normal, cardiac troponin normal, urinalysis negative for hematuria or UTI, CMP with hyperglycemia to 170, otherwise unremarkable, CBC without a leukocytosis or anemia.  CT of the L-spine was pending at time of signout.  Plan to follow-up results of L-spine imaging, reassess the patient and determine need for further evaluation, ultimate disposition pending results of diagnostic testing and reassessment, signout given to Dr. Mittie Anchors at [phone].   Final Clinical Impression(s) / ED Diagnoses Final diagnoses:  None    Rx / DC Orders ED Discharge Orders     None         Rosealee Concha, MD 03/15/24 2025

## 2024-03-15 NOTE — ED Provider Notes (Signed)
 Probable constipation, CT negative, needs bowel regimen. Having B/L extremity weakness after falls/syncope from toilet. CT lumbar pending. Physical Exam  BP 134/83   Pulse 63   Temp 98 F (36.7 C)   Resp 16   Ht 5' 7 (1.702 m)   Wt 63.5 kg   SpO2 100%   BMI 21.93 kg/m   Physical Exam  Procedures  Procedures  ED Course / MDM    Medical Decision Making Amount and/or Complexity of Data Reviewed Labs: ordered. Radiology: ordered.  Risk OTC drugs. Prescription drug management.   17:06 Callback from radiology regarding CT findings.  They suspect nondisplaced fractures in lower T-spine.  But also noted potential abnormality of aorta with possible intramural thrombus.  Recommendation is for dedicated CTA dissection study.  I have ordered this.  CT angio did not show any dissection or vascular emergency.  Review of CT scans do show probable mild compression fracture at T11 and T12.  Compression fractures at T11-T12 correspond very well with the patient's area of pain.  We discussed pain control at home using a combination of extra strength Tylenol and 1-2 tramadol tablets as needed.  I reviewed this with the patient and her daughter and they felt this was a good start for their pain control as she does not use any pain medications at home.  Patient does have Parkinson's and has been having difficulty with increasing incoordination and falls.  They are scheduled to follow-up with the neurologist Friday, 2 days from now to discuss strategies for managing this.  At this time I do not think there is other emergent issue regarding falls.  Patient has home assistance and a plan in place for follow-up.  Discharged in stable condition having reviewed all diagnostic results and home care.       Wynetta Heckle, MD 03/15/24 2032

## 2024-03-15 NOTE — Discharge Instructions (Signed)
 1.  Take 2 extra strength Tylenol tablets every 6-8 hours as needed for pain.  If you need additional pain control, you may add 1-2 tramadol tablets. 2.  For constipation, buy over-the-counter Colace stool softener to take twice daily.  Get MiraLAX and take daily until you are having regular bowel movement.  After that continue the Colace but take the MiraLAX as needed every couple of days if you are experiencing constipation. 3.  Follow-up with your neurologist and family doctor as planned.  Return to the emergency department if you have new worsening or concerning symptoms.

## 2024-03-15 NOTE — ED Triage Notes (Signed)
 Pt c/o dizziness and weakness that started 2 weeks ago. Dx with parkinsons 4 months ago. Denies syncopal events. Pt states she fell 4 times in the past week due to dizziness. C/O vomiting. Denies blurred vision.

## 2024-03-17 ENCOUNTER — Ambulatory Visit: Admitting: Physical Therapy

## 2024-03-17 ENCOUNTER — Encounter: Payer: Self-pay | Admitting: Neurology

## 2024-03-17 ENCOUNTER — Ambulatory Visit: Admitting: Neurology

## 2024-03-17 VITALS — BP 117/69 | HR 56 | Ht 67.0 in | Wt 142.0 lb

## 2024-03-17 DIAGNOSIS — R209 Unspecified disturbances of skin sensation: Secondary | ICD-10-CM

## 2024-03-17 DIAGNOSIS — G20C Parkinsonism, unspecified: Secondary | ICD-10-CM | POA: Diagnosis not present

## 2024-03-17 DIAGNOSIS — R29898 Other symptoms and signs involving the musculoskeletal system: Secondary | ICD-10-CM

## 2024-03-17 DIAGNOSIS — G629 Polyneuropathy, unspecified: Secondary | ICD-10-CM

## 2024-03-17 DIAGNOSIS — R292 Abnormal reflex: Secondary | ICD-10-CM

## 2024-03-17 DIAGNOSIS — R2689 Other abnormalities of gait and mobility: Secondary | ICD-10-CM

## 2024-03-17 DIAGNOSIS — R296 Repeated falls: Secondary | ICD-10-CM

## 2024-03-17 DIAGNOSIS — R413 Other amnesia: Secondary | ICD-10-CM

## 2024-03-17 NOTE — Patient Instructions (Signed)
 -Agree with home PT through home health as planned -Continue Sinemet  1 tablet three times daily -MRI cervical   The physicians and staff at [facility] are committed to providing excellent care. You may receive a survey requesting feedback about your experience at our office. We strive to receive very good responses to the survey questions. If you feel that your experience would prevent you from giving the office a very good  response, please contact our office to try to remedy the situation. We may be reached at [phone]. Thank you for taking the time out of your busy day to complete the survey.  Brooke Coats, MD [facility]  Preventing Falls at [facility] are common, often dreaded events in the lives of older people. Aside from the obvious injuries and even death that may result, fall can cause wide-ranging consequences including loss of independence, mental decline, decreased activity and mobility. Younger people are also at risk of falling, especially those with chronic illnesses and fatigue.  Ways to reduce risk for falling Examine diet and medications. Warm foods and alcohol dilate blood vessels, which can lead to dizziness when standing. Sleep aids, antidepressants and pain medications can also increase the likelihood of a fall.  Get a vision exam. Poor vision, cataracts and glaucoma increase the chances of falling.  Check foot gear. Shoes should fit snugly and have a sturdy, nonskid sole and a broad, low heel  Participate in a physician-approved exercise program to build and maintain muscle strength and improve balance and coordination. Programs that use ankle weights or stretch bands are excellent for muscle-strengthening. Water aerobics programs and low-impact Tai Chi programs have also been shown to improve balance and coordination.  Increase vitamin D intake. Vitamin D improves muscle strength and increases the amount of calcium the body is able to absorb and  deposit in bones.  How to prevent falls from common hazards Floors - Remove all loose wires, cords, and throw rugs. Minimize clutter. Make sure rugs are anchored and smooth. Keep furniture in its usual place.  Chairs -- Use chairs with straight backs, armrests and firm seats. Add firm cushions to existing pieces to add height.  Bathroom - Install grab bars and non-skid tape in the tub or shower. Use a bathtub transfer bench or a shower chair with a back support Use an elevated toilet seat and/or safety rails to assist standing from a low surface. Do not use towel racks or bathroom tissue holders to help you stand.  Lighting - Make sure halls, stairways, and entrances are well-lit. Install a night light in your bathroom or hallway. Make sure there is a light switch at the top and bottom of the staircase. Turn lights on if you get up in the middle of the night. Make sure lamps or light switches are within reach of the bed if you have to get up during the night.  Kitchen - Install non-skid rubber mats near the sink and stove. Clean spills immediately. Store frequently used utensils, pots, pans between waist and eye level. This helps prevent reaching and bending. Sit when getting things out of lower cupboards.  Living room/ Bedrooms - Place furniture with wide spaces in between, giving enough room to move around. Establish a route through the living room that gives you something to hold onto as you walk.  Stairs - Make sure treads, rails, and rugs are secure. Install a rail on both sides of the stairs. If stairs are a threat, it might be helpful  to arrange most of your activities on the lower level to reduce the number of times you must climb the stairs.  Entrances and doorways - Install metal handles on the walls adjacent to the doorknobs of all doors to make it more secure as you travel through the doorway.  Tips for maintaining balance Keep at least one hand free at all times. Try using a backpack  or fanny pack to hold things rather than carrying them in your hands. Never carry objects in both hands when walking as this interferes with keeping your balance.  Attempt to swing both arms from front to back while walking. This might require a conscious effort if Parkinson's disease has diminished your movement. It will, however, help you to maintain balance and posture, and reduce fatigue.  Consciously lift your feet off of the ground when walking. Shuffling and dragging of the feet is a common culprit in losing your balance.  When trying to navigate turns, use a U technique of facing forward and making a wide turn, rather than pivoting sharply.  Try to stand with your feet shoulder-length apart. When your feet are close together for any length of time, you increase your risk of losing your balance and falling.  Do one thing at a time. Don't try to walk and accomplish another task, such as reading or looking around. The decrease in your automatic reflexes complicates motor function, so the less distraction, the better.  Do not wear rubber or gripping soled shoes, they might catch on the floor and cause tripping.  Move slowly when changing positions. Use deliberate, concentrated movements and, if needed, use a grab bar or walking aid. Count 15 seconds between each movement. For example, when rising from a seated position, wait 15 seconds after standing to begin walking.  If balance is a continuous problem, you might want to consider a walking aid such as a cane, walking stick, or walker. Once you've mastered walking with help, you might be ready to try it on your own again.

## 2024-03-20 NOTE — Telephone Encounter (Signed)
 Can we check on the home health referral? Not sure who to send this to.

## 2024-03-21 ENCOUNTER — Telehealth: Payer: Self-pay

## 2024-03-21 ENCOUNTER — Ambulatory Visit: Admitting: Physical Therapy

## 2024-03-21 NOTE — Telephone Encounter (Signed)
 Pt MRI CS with out is approved #[id number] Approval through  03/20/2024/  04/18/2024 [employer] [place]

## 2024-03-21 NOTE — Telephone Encounter (Signed)
 Called pt and made her aware of approval and to call and schedule appointment. Number was given.

## 2024-03-24 ENCOUNTER — Ambulatory Visit (HOSPITAL_COMMUNITY)
Admission: RE | Admit: 2024-03-24 | Discharge: 2024-03-24 | Disposition: A | Discharge status: Home / Self Care | Source: Ambulatory Visit | Attending: Neurology | Admitting: Neurology

## 2024-03-24 ENCOUNTER — Ambulatory Visit: Admitting: Physical Therapy

## 2024-03-24 DIAGNOSIS — R29898 Other symptoms and signs involving the musculoskeletal system: Secondary | ICD-10-CM | POA: Insufficient documentation

## 2024-03-24 DIAGNOSIS — M5020 Other cervical disc displacement, unspecified cervical region: Secondary | ICD-10-CM | POA: Diagnosis not present

## 2024-03-24 DIAGNOSIS — R2689 Other abnormalities of gait and mobility: Secondary | ICD-10-CM | POA: Insufficient documentation

## 2024-03-24 DIAGNOSIS — G629 Polyneuropathy, unspecified: Secondary | ICD-10-CM | POA: Diagnosis not present

## 2024-03-24 DIAGNOSIS — Z043 Encounter for examination and observation following other accident: Secondary | ICD-10-CM | POA: Diagnosis not present

## 2024-03-24 DIAGNOSIS — M4802 Spinal stenosis, cervical region: Secondary | ICD-10-CM | POA: Diagnosis not present

## 2024-03-24 DIAGNOSIS — M47812 Spondylosis without myelopathy or radiculopathy, cervical region: Secondary | ICD-10-CM | POA: Diagnosis not present

## 2024-03-24 DIAGNOSIS — R296 Repeated falls: Secondary | ICD-10-CM | POA: Insufficient documentation

## 2024-03-24 DIAGNOSIS — G20C Parkinsonism, unspecified: Secondary | ICD-10-CM | POA: Insufficient documentation

## 2024-03-24 DIAGNOSIS — R413 Other amnesia: Secondary | ICD-10-CM | POA: Diagnosis not present

## 2024-03-24 DIAGNOSIS — R209 Unspecified disturbances of skin sensation: Secondary | ICD-10-CM | POA: Diagnosis not present

## 2024-03-27 DIAGNOSIS — S22009D Unspecified fracture of unspecified thoracic vertebra, subsequent encounter for fracture with routine healing: Secondary | ICD-10-CM | POA: Diagnosis not present

## 2024-03-27 DIAGNOSIS — E1042 Type 1 diabetes mellitus with diabetic polyneuropathy: Secondary | ICD-10-CM | POA: Diagnosis not present

## 2024-03-27 DIAGNOSIS — Z9181 History of falling: Secondary | ICD-10-CM | POA: Diagnosis not present

## 2024-03-27 DIAGNOSIS — I10 Essential (primary) hypertension: Secondary | ICD-10-CM | POA: Diagnosis not present

## 2024-03-27 DIAGNOSIS — K5900 Constipation, unspecified: Secondary | ICD-10-CM | POA: Diagnosis not present

## 2024-03-27 DIAGNOSIS — Z9049 Acquired absence of other specified parts of digestive tract: Secondary | ICD-10-CM | POA: Diagnosis not present

## 2024-03-27 DIAGNOSIS — R32 Unspecified urinary incontinence: Secondary | ICD-10-CM | POA: Diagnosis not present

## 2024-03-27 DIAGNOSIS — G20A1 Parkinson's disease without dyskinesia, without mention of fluctuations: Secondary | ICD-10-CM | POA: Diagnosis not present

## 2024-03-27 DIAGNOSIS — S32009D Unspecified fracture of unspecified lumbar vertebra, subsequent encounter for fracture with routine healing: Secondary | ICD-10-CM | POA: Diagnosis not present

## 2024-03-27 DIAGNOSIS — S3210XD Unspecified fracture of sacrum, subsequent encounter for fracture with routine healing: Secondary | ICD-10-CM | POA: Diagnosis not present

## 2024-03-27 DIAGNOSIS — Z556 Problems related to health literacy: Secondary | ICD-10-CM | POA: Diagnosis not present

## 2024-03-27 DIAGNOSIS — Z993 Dependence on wheelchair: Secondary | ICD-10-CM | POA: Diagnosis not present

## 2024-03-27 DIAGNOSIS — W19XXXD Unspecified fall, subsequent encounter: Secondary | ICD-10-CM | POA: Diagnosis not present

## 2024-03-27 DIAGNOSIS — K8591 Acute pancreatitis with uninfected necrosis, unspecified: Secondary | ICD-10-CM | POA: Diagnosis not present

## 2024-03-27 DIAGNOSIS — R296 Repeated falls: Secondary | ICD-10-CM | POA: Diagnosis not present

## 2024-03-28 ENCOUNTER — Ambulatory Visit: Admitting: Physical Therapy

## 2024-03-28 DIAGNOSIS — E1042 Type 1 diabetes mellitus with diabetic polyneuropathy: Secondary | ICD-10-CM | POA: Diagnosis not present

## 2024-03-28 DIAGNOSIS — S22009D Unspecified fracture of unspecified thoracic vertebra, subsequent encounter for fracture with routine healing: Secondary | ICD-10-CM | POA: Diagnosis not present

## 2024-03-28 DIAGNOSIS — R32 Unspecified urinary incontinence: Secondary | ICD-10-CM | POA: Diagnosis not present

## 2024-03-28 DIAGNOSIS — K5900 Constipation, unspecified: Secondary | ICD-10-CM | POA: Diagnosis not present

## 2024-03-28 DIAGNOSIS — G20A1 Parkinson's disease without dyskinesia, without mention of fluctuations: Secondary | ICD-10-CM | POA: Diagnosis not present

## 2024-03-28 DIAGNOSIS — Z993 Dependence on wheelchair: Secondary | ICD-10-CM | POA: Diagnosis not present

## 2024-03-28 DIAGNOSIS — S32009D Unspecified fracture of unspecified lumbar vertebra, subsequent encounter for fracture with routine healing: Secondary | ICD-10-CM | POA: Diagnosis not present

## 2024-03-28 DIAGNOSIS — R296 Repeated falls: Secondary | ICD-10-CM | POA: Diagnosis not present

## 2024-03-28 DIAGNOSIS — Z9049 Acquired absence of other specified parts of digestive tract: Secondary | ICD-10-CM | POA: Diagnosis not present

## 2024-03-28 DIAGNOSIS — S3210XD Unspecified fracture of sacrum, subsequent encounter for fracture with routine healing: Secondary | ICD-10-CM | POA: Diagnosis not present

## 2024-03-28 DIAGNOSIS — K8591 Acute pancreatitis with uninfected necrosis, unspecified: Secondary | ICD-10-CM | POA: Diagnosis not present

## 2024-03-28 DIAGNOSIS — Z556 Problems related to health literacy: Secondary | ICD-10-CM | POA: Diagnosis not present

## 2024-03-28 DIAGNOSIS — I10 Essential (primary) hypertension: Secondary | ICD-10-CM | POA: Diagnosis not present

## 2024-03-28 DIAGNOSIS — Z9181 History of falling: Secondary | ICD-10-CM | POA: Diagnosis not present

## 2024-03-28 DIAGNOSIS — W19XXXD Unspecified fall, subsequent encounter: Secondary | ICD-10-CM | POA: Diagnosis not present

## 2024-03-31 ENCOUNTER — Ambulatory Visit: Admitting: Physical Therapy

## 2024-04-05 ENCOUNTER — Ambulatory Visit: Payer: Self-pay | Admitting: Neurology

## 2024-04-05 ENCOUNTER — Other Ambulatory Visit: Payer: Self-pay

## 2024-04-05 DIAGNOSIS — M5412 Radiculopathy, cervical region: Secondary | ICD-10-CM

## 2024-04-10 NOTE — Progress Notes (Signed)
 NEUROLOGY FOLLOW UP OFFICE NOTE  Brooke Morse [id number]  Subjective:  Brooke Morse is a 78 y.o. 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  To briefly review: 12/16/23: Patient has problems with her balance. She thinks this has been going on about 1 year. She has to hold on to things when walking. She has fallen about 3 times since 10/06/23. She thinks it is due to leg weakness. She will usually will lower herself down due to the leg weakness. There seems to be core weakness too as she can have difficulty sitting up. She has been working with a Systems analyst 3 times per week for about 5 months and there has been little improvement. She has a rollator that she uses 90% of the time. Daughter sees coordination as the biggest issue with using the rollator.    She denies numbness or tingling in legs or feet. She denies symptoms in her arms. She denies neck or back pain.   She has good sense of smell. She denies feeling frozen. Daughter does wonder though because when she loses her balance sometimes she does not take a step. She denies tremors. There is no concern for REM sleep disorder.   She does endorse some short term memory problems for which she was previously on memantine  5 mg BID. She stopped all of her medications except insulin for DM. She is able to take care of herself and handles her own finances. She lives with her daughter.   She sleeps about 5-6 hours per night. She takes a lot of naps during the day.  Per daughter her sleep hygiene could be better.   She has lost 10-15 lbs, mostly due to lack of appetite. She denies feeling depressed or down. She denies fevers or night sweats.    report any constitutional symptoms like fever, night sweats, anorexia or unintentional weight loss.   EtOH use: None  Restrictive diet? No Family history of neuropathy/myopathy/neurologic disease?  No  I started a Sinemet  trial (1/2 tablet TID) on 12/16/23.  03/17/24: Per my phone note on 01/11/24: Called and spoke with daughter. Patient is the same as prior. There has been no difference with 1/2 tablet of sinemet  TID thus far. Patient is still having memory issues and imbalance with one fall in bathroom since last clinic visit. She does not have any significant urinary incontinence.   I discussed the results of the MRI brain. It showed significant atrophy. In my opinion, her ventriculomegaly is not out of proportion to atrophy, but concern was raised by radiology about NPH. She does have cognitive changes and gait abnormality, so clinically, this may be a consideration.   I discussed the options with patient's daughter: Increase Sinemet  to 1 tablet TID to monitor for response Pursue lumbar puncture and PT evaluation of gait before and after for NPH Continue with current treatment and monitor   Daughter will discuss with patient and get back to me on any questions or what they would like to do.   They decided to go forward with LP, which was normal. PT and patient noted no change in symptoms (gait or cognition) after LP. NPH is unlikely. I recommended patient increase Sinemet  to 1 tablet TID. Daughter is not sure this helped. She has been throwing up a lot due to gallbladder per daughter. Daughter is concerned that this is keeping her from keeping down  medications, but this was only over the last couple of weeks.   Patient has been falling a lot. She states she feels like she has a swimming sensation then falls. She had to go to the ED on 03/15/24. She had been constipated and was weak over the prior week. CT of spine showed possible fracture at T12. She was given tramadol  and tylenol . She denies any significant pain now. She also thinks the dizziness has been better since ED visit.   She denies any significant pain today, including neck pain.  Most recent Assessment and Plan (03/17/24): This  is Brooke Morse, a 78 y.o. female with imbalance and falls. Her examination is pertinent for increased tone and shuffling gait. She is very unsteady, even with a walker. 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 I am concerned for parkinsonism. Her MRI brain showed ventriculomegaly but LP did not produce improvement as would be expected with NPH. She has not had clear response to Sinemet , but has had GI problems including vomiting and constipation that may complicating that response. Other possibility that should be evaluated is is cervical stenosis.   Plan: -Agree with home PT through home health as planned -Continue Sinemet  1 tablet TID -MRI cervical wo contrast -Fall precautions discussed. Recommended wheelchair if needed to prevent falls.  Since their last visit: MRI cervical spine showed multilevel spondylosis with mild to moderate spinal stenosis (C5-6 and C6-7) and severe left and moderate right C4 foraminal stenosis, severe right C5, and severe bilateral C6-7 foraminal stenosis. I recommended patient see neurosurgery for their opinion, but patient preferred to wait and discuss with me today. She does have an appointment with Dr. Claudene on 04/24/24 in [place].  There was also an issue with patient's Sinemet  where she was briefly given CR sinemet  by pharmacy instead of IR. This has been corrected by the pharmacy and patient now has Sinemet  IR. She is prescribed 1 tablet TID but not taking since mix up at pharmacy. Per daughter, she has not noticed a difference taking the medication.  Patient is feeling okay with no significant pain. She is using a rollator and not had any falls since last visit. Per daughter, she still moves very slowly and scared of falling. Daughter also mentions her cognition still seems off. Patient is not sure.   MEDICATIONS:  Outpatient  Encounter Medications as of 04/20/2024  Medication Sig   aspirin 81 MG tablet Take 81 mg by mouth. About 2 times a week (Patient not taking: Reported on 10/22/2023)   atorvastatin (LIPITOR) 20 MG tablet Take 20 mg by mouth. 2 a week (Patient not taking: Reported on 12/16/2023)   carbidopa -levodopa  (SINEMET  IR) 25-100 MG tablet Take 1 tablet by mouth 3 (three) times daily.   Continuous Glucose Sensor (DEXCOM G7 SENSOR) MISC USE 1 EACH EVERY 10 DAYS   Fiber, Guar Gum, CHEW Chew 1 tablet by mouth daily.   insulin lispro (HUMALOG) 100 UNIT/ML KiwkPen Inject 12 Units as directed 3 (three) times daily.   Insulin Pen Needle (FIFTY50 PEN NEEDLES) 32G X 4 MM MISC USE FOUR TIMES A DAY AS DIRECTED.   Lancets (ONETOUCH DELICA PLUS LANCET33G) MISC USE TO TEST BLOOD SUGAR FOUR TIMES DAILY AS INSTRUCTED   LANTUS SOLOSTAR 100 UNIT/ML Solostar Pen Inject 8 Units into the skin daily.   ONETOUCH ULTRA test strip    traMADol  (ULTRAM )  50 MG tablet 1-2 tablets every 6 hours as needed   No facility-administered encounter medications on file as of 04/20/2024.    PAST MEDICAL HISTORY: Past Medical History:  Diagnosis Date   Diabetes mellitus without complication (HCC)    Hyperlipemia     PAST SURGICAL HISTORY: Past Surgical History:  Procedure Laterality Date   bunion removal     COLONOSCOPY WITH PROPOFOL  N/A 03/18/2015   Procedure: COLONOSCOPY WITH PROPOFOL ;  Surgeon: Deward CINDERELLA Piedmont, MD;  Location: [facility] ENDOSCOPY;  Service: Gastroenterology;  Laterality: N/A;    ALLERGIES: Allergies  Allergen Reactions   Peanut-Containing Drug Products Itching    FAMILY HISTORY: Family History  Problem Relation Age of Onset   Colon cancer Father    Asthma Father     SOCIAL HISTORY: Social History   Tobacco Use   Smoking status: Former    Types: Cigarettes   Smokeless tobacco: Never   Tobacco comments:    Quit more than 20 years ago  Vaping Use   Vaping status: Never Used  Substance Use Topics   Alcohol use: No    Drug use: No   Social History   Social History Narrative   Divorced.   2 children, 5 grandchildren.   Retired. Once worked at a cigarette factory.   Enjoys sewing, cooking, walks 2 miles daily.    Are you right handed or left handed? Right   What is your current occupation?   Who lives with you? Daughter   What type of home do you live in: 1 story or 2 story? one          Objective:  Vital Signs:  BP 126/72   Pulse 73   SpO2 96%   General: General appearance: Awake and alert. No distress. Cooperative with exam.  Skin: No obvious rash or jaundice. HEENT: Atraumatic. Anicteric. Lungs: Non-labored breathing on room air  Extremities: No edema  Neurological: Mental Status: Alert. Speech fluent. No pseudobulbar affect Cranial Nerves: CNII: No RAPD. Visual fields intact. CNIII, IV, VI: PERRL. No nystagmus. EOMI. CN V: Facial sensation intact bilaterally to fine touch. CN VII: Facial muscles symmetric and strong. No ptosis at rest. CN VIII: Hears finger rub well bilaterally. CN IX: No hypophonia. CN X: Palate elevates symmetrically. CN XI: Full strength shoulder shrug bilaterally. CN XII: Tongue protrusion full and midline. No atrophy or fasciculations. No significant dysarthria Motor: Tone is increased in all extremities (poor relaxation may be at least contributing)  Individual muscle group testing (MRC grade out of 5):  Movement     Neck flexion 5    Neck extension 5     Right Left   Shoulder abduction 5 5   Elbow flexion 5 5   Elbow extension 5 5   Finger abduction - FDI 5- 5-   Finger abduction - ADM 4+ 4+   Finger extension 5 4+   Finger flexion 5 5    Hip flexion 4+ 4+   Hip extension 5 5   Hip adduction 5 5   Hip abduction 5 5   Knee extension 5 5   Knee flexion 5 5   Dorsiflexion 5 5   Plantarflexion 5 5    Reflexes:  Right Left   Bicep 2+ 2+   Tricep 2+ 2+   BrRad 2+ 2+   Knee 2+ 2+   Ankle 0 0    Pathological Reflexes: Babinski:  mute response bilaterally Hoffman: absent bilaterally Troemner: absent bilaterally Sensation: Pinprick: Diminished in  bilateral feet, otherwise intact Vibration: Diminished in bilateral great toes Coordination: Intact finger-to- nose-finger bilaterally. No clear decrementing on finger tapping. Gait: Not able to safely ambulate today.   Labs and Imaging review: New results: MRI cervical spine wo contrast (03/24/24): FINDINGS: Alignment: Reversal of the normal cervical lordosis, apex at C3-4. No significant listhesis.   Vertebrae: Vertebral body height maintained without acute or chronic fracture. Bone marrow signal intensity within normal limits. No worrisome osseous lesions. No abnormal marrow edema.   Cord: Normal signal and morphology.   Posterior Fossa, vertebral arteries, paraspinal tissues: Partially empty sella noted. Craniocervical junction normal. Paraspinous soft tissues within normal limits. Normal flow voids seen within the vertebral arteries bilaterally.   Disc levels:   C2-C3: Small left paracentral disc protrusion mildly indents the left ventral thecal sac. Mild right-sided facet hypertrophy. No spinal stenosis. Foramina remain patent.   C3-C4: Degenerative disc space narrowing with diffuse disc osteophyte complex. Superimposed shallow central disc protrusion indents the ventral thecal sac. Mild cord flattening without cord signal changes. No significant spinal stenosis. Severe left with moderate right C4 foraminal stenosis.   C4-C5: Degenerative intervertebral disc space narrowing with circumferential disc osteophyte complex. Mild right-sided facet hypertrophy. No spinal stenosis. Severe right C5 foraminal narrowing. Left neural foramina remains patent.   C5-C6: Degenerative vertebral disc space narrowing with diffuse disc osteophyte complex. Flattening and partial effacement of the ventral thecal sac. Superimposed mild facet and ligament flavum  hypertrophy. Resultant mild spinal stenosis. Severe left worse than right C6 foraminal narrowing.   C6-C7: Degenerative disc space narrowing with diffuse disc osteophyte complex, asymmetric to the left. Flattening and partial effacement of the ventral thecal sac. Secondary cord flattening without cord signal changes. Mild left facet hypertrophy with ligament flavum thickening. Resultant severe left-sided spinal stenosis, with severe left worse than right C7 foraminal narrowing.   C7-T1: Negative interspace. Mild bilateral facet hypertrophy. No spinal stenosis. Foramina remain patent.   T1-2: Negative interspace. Mild facet hypertrophy. No significant stenosis.   T2-3: Small left paracentral to foraminal disc protrusion with moderate left foraminal stenosis.   IMPRESSION: 1. Multilevel cervical spondylosis with resultant mild to moderate spinal stenosis at C5-6 and C6-7. 2. Multifactorial degenerative changes with resultant multilevel foraminal narrowing as above. Notable findings include severe left with moderate right C4 foraminal stenosis, severe right C5 foraminal narrowing, severe left worse than right C6 and C7 foraminal stenosis. 3. Normal MRI appearance of the cervical spinal cord.  Previously reviewed results: 12/16/23: Copper  wnl Vit E wnl B1 wnl B12: 459 IFE no M protein   CSF (01/17/24): opening pressure 15 cm of H2O 7 RBC, 1 WBC, 35 protein, 103 glucose IgG index wnl Cytology wnl   HbA1c (01/18/24): 9   Hepatic function panel (11/23/23) unremarkable   10/22/23: CMP significant for glucose 321, AST 46, ALT 114   HbA1c (09/16/23): 8.7 B12 (05/19/22): 239   External labs: 05/31/23: TSH wnl Lipid panel: tChol 241, LDL 148, TG 77 HbA1c: 9   Imaging/Procedures: Lumbar spine xray (06/01/2007): IMPRESSION:  1. No fracture is seen.  2. There is a mild spondylolisthesis without spondylolysis at the L4-L5  level where there is also observed narrowing of the  disc space compatible  disc disease. This could be further evaluated by MR if clinically  indicated.  2. There is a nonspecific calcific appearing density superior to the L4  lumbar transverse process on the RIGHT. As noted above this likely  represents a phlebolith but the possibility of a RIGHT  ureteral stone  cannot  be entirely excluded from the differential and if such is a clinical  concern, CT examination utilizing stone protocol would be recommended.   MRI brain wo contrast (12/27/23): FINDINGS: Brain: Ventriculomegaly which is generalized with callosal angle narrowing and disproportionate subarachnoid spaces on coronal T2 weighted imaging. There is a degree of brain atrophy especially of the periatrial white matter with ventricular scalloping. FLAIR hyperintensity in the cerebral white matter, usually related to chronic small vessel ischemia. No cortical infarct, mass, or collection. Hypointense nodule along the left occipital horn which may be from remote hemorrhage or mineralization, nonspecific in isolation. Normal appearance of the cerebellum and brainstem.   Vascular: Major flow voids are preserved   Skull and upper cervical spine: Normal marrow signal.   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   Mild chronic white matter disease.   No recent insult.  Assessment/Plan:  This is Brooke Morse, a 78 y.o. female with imbalance and falls. She has increased tone and unsteady gait. There is some diminished sensation in her feet, likely from diabetic neuropathy, but her imbalance is out of proportion to this sensory loss. I was initially concerned about parkisonism, but patient has not significantly responded to Sinemet  (though only trialed 1 tablet TID), and now MRI cervical spine shows cervical stenosis with possible contact with cord. I wonder if this is the  driving factor in her symptoms. She is seeing NSGY next week for their opinion. Patient has not been on Sinemet  recently, so I will have her continue to hold this until she sees NSGY.  Plan: -Hold off on Sinemet  until after patient sees NSGY next week. Will discuss with patient next week -See Dr. Claudene in NSGY as planned on 04/24/24 -May consider neuropsych testing in future if interested in further evaluation for cognitive changes -Fall precautions discussed  Return to clinic in 3-4 months  Total time spent reviewing records, interview, history/exam, documentation, and coordination of care on day of encounter:  50 min  Venetia Potters, MD

## 2024-04-11 ENCOUNTER — Ambulatory Visit: Admitting: Physical Therapy

## 2024-04-12 ENCOUNTER — Telehealth: Payer: Self-pay

## 2024-04-12 ENCOUNTER — Ambulatory Visit: Payer: Medicare Other

## 2024-04-12 VITALS — Ht 67.0 in | Wt 142.0 lb

## 2024-04-12 DIAGNOSIS — Z0000 Encounter for general adult medical examination without abnormal findings: Secondary | ICD-10-CM

## 2024-04-12 NOTE — Patient Instructions (Signed)
 Brooke Morse , Thank you for taking time out of your busy schedule to complete your Annual Wellness Visit with me. I enjoyed our conversation and look forward to speaking with you again next year. I, as well as your care team,  appreciate your ongoing commitment to your health goals. Please review the following plan we discussed and let me know if I can assist you in the future. Your Game plan/ To Do List     Follow up Visits: Next Medicare AWV with our clinical staff: 04/13/25 @ 1:40pm televisit   Have you seen your provider in the last 6 months (3 months if uncontrolled diabetes)? Yes televisit Next Office Visit with your provider: not scheduled  Clinician Recommendations:  Aim for 30 minutes of exercise or brisk walking, 6-8 glasses of water, and 5 servings of fruits and vegetables each day.       This is a list of the screening recommended for you and due dates:  Health Maintenance  Topic Date Due   Zoster (Shingles) Vaccine (1 of 2) Never done   Pneumococcal Vaccine for age over 32 (2 of 2 - PCV) 01/07/2016   Complete foot exam   07/10/2022   Flu Shot  05/05/2024   Yearly kidney health urinalysis for diabetes  05/30/2024   Hemoglobin A1C  07/19/2024   Eye exam for diabetics  11/04/2024   Yearly kidney function blood test for diabetes  03/15/2025   Medicare Annual Wellness Visit  04/12/2025   DEXA scan (bone density measurement)  Completed   Hepatitis C Screening  Completed   Hepatitis B Vaccine  Aged Out   HPV Vaccine  Aged Out   Meningitis B Vaccine  Aged Out   DTaP/Tdap/Td vaccine  Discontinued   Colon Cancer Screening  Discontinued   COVID-19 Vaccine  Discontinued    Advanced directives: (Declined) Advance directive discussed with you today. Even though you declined this today, please call our office should you change your mind, and we can give you the proper paperwork for you to fill out. Advance Care Planning is important because it:  [x]  Makes sure you receive the medical  care that is consistent with your values, goals, and preferences  [x]  It provides guidance to your family and loved ones and reduces their decisional burden about whether or not they are making the right decisions based on your wishes.  Follow the link provided in your after visit summary or read over the paperwork we have mailed to you to help you started getting your Advance Directives in place. If you need assistance in completing these, please reach out to us  so that we can help you!

## 2024-04-12 NOTE — Telephone Encounter (Signed)
 Called Pharmacy and reported Dr. Venus answer. She understood.

## 2024-04-12 NOTE — Progress Notes (Signed)
 Subjective:   Brooke Morse is a 78 y.o. who presents for a Medicare Wellness preventive visit.  As a reminder, Annual Wellness Visits don't include a physical exam, and some assessments may be limited, especially if this visit is performed virtually. We may recommend an in-person follow-up visit with your provider if needed.  Visit Complete: Virtual I connected with  Brooke Morse on 04/12/24 by a audio enabled telemedicine application and verified that I am speaking with the correct person using two identifiers.  Patient Location: Home  Provider Location: Home Office  I discussed the limitations of evaluation and management by telemedicine. The patient expressed understanding and agreed to proceed.  Vital Signs: Because this visit was a virtual/telehealth visit, some criteria may be missing or patient reported. Any vitals not documented were not able to be obtained and vitals that have been documented are patient reported.  VideoDeclined- This patient declined Librarian, academic. Therefore the visit was completed with audio only.  Persons Participating in Visit: Patient.  AWV Questionnaire: No: Patient Medicare AWV questionnaire was not completed prior to this visit.  Cardiac Risk Factors include: advanced age (>46men, >51 women);diabetes mellitus;hypertension;dyslipidemia;sedentary lifestyle     Objective:    Today's Vitals   04/12/24 1346  Weight: 142 lb (64.4 kg)  Height: 5' 7 (1.702 m)   Body mass index is 22.24 kg/m.     04/12/2024    2:13 PM 03/17/2024   11:33 AM 02/29/2024    3:30 PM 01/17/2024    8:55 AM 12/16/2023   12:59 PM 04/12/2023    2:40 PM 04/08/2022    2:08 PM  Advanced Directives  Does Patient Have a Medical Advance Directive? No No No Yes No No No  Type of Theme park manager;Living will     Would patient like information on creating a medical advance directive?      No - Patient declined No -  Patient declined    Current Medications (verified) Outpatient Encounter Medications as of 04/12/2024  Medication Sig   carbidopa -levodopa  (SINEMET  IR) 25-100 MG tablet Take 1 tablet by mouth 3 (three) times daily.   Continuous Glucose Sensor (DEXCOM G7 SENSOR) MISC USE 1 EACH EVERY 10 DAYS   Fiber, Guar Gum, CHEW Chew 1 tablet by mouth daily.   insulin lispro (HUMALOG) 100 UNIT/ML KiwkPen Inject 12 Units as directed 3 (three) times daily.   Insulin Pen Needle (FIFTY50 PEN NEEDLES) 32G X 4 MM MISC USE FOUR TIMES A DAY AS DIRECTED.   Lancets (ONETOUCH DELICA PLUS LANCET33G) MISC USE TO TEST BLOOD SUGAR FOUR TIMES DAILY AS INSTRUCTED   LANTUS SOLOSTAR 100 UNIT/ML Solostar Pen Inject 8 Units into the skin daily.   ONETOUCH ULTRA test strip    traMADol  (ULTRAM ) 50 MG tablet 1-2 tablets every 6 hours as needed   aspirin 81 MG tablet Take 81 mg by mouth. About 2 times a week (Patient not taking: Reported on 10/22/2023)   atorvastatin (LIPITOR) 20 MG tablet Take 20 mg by mouth. 2 a week (Patient not taking: Reported on 12/16/2023)   No facility-administered encounter medications on file as of 04/12/2024.    Allergies (verified) Peanut-containing drug products   History: Past Medical History:  Diagnosis Date   Diabetes mellitus without complication (HCC)    Hyperlipemia    Past Surgical History:  Procedure Laterality Date   bunion removal     COLONOSCOPY WITH PROPOFOL  N/A 03/18/2015   Procedure: COLONOSCOPY  WITH PROPOFOL ;  Surgeon: Deward CINDERELLA Piedmont, MD;  Location: [facility] ENDOSCOPY;  Service: Gastroenterology;  Laterality: N/A;   Family History  Problem Relation Age of Onset   Colon cancer Father    Asthma Father    Social History   Socioeconomic History   Marital status: Divorced    Spouse name: Not on file   Number of children: Not on file   Years of education: Not on file   Highest education level: 12th grade  Occupational History   Not on file  Tobacco Use   Smoking status: Former     Types: Cigarettes   Smokeless tobacco: Never   Tobacco comments:    Quit more than 20 years ago  Vaping Use   Vaping status: Never Used  Substance and Sexual Activity   Alcohol use: No   Drug use: No   Sexual activity: Not on file  Other Topics Concern   Not on file  Social History Narrative   Divorced.   2 children, 5 grandchildren.   Retired. Once worked at a cigarette factory.   Enjoys sewing, cooking, walks 2 miles daily.    Are you right handed or left handed? Right   What is your current occupation?   Who lives with you? Daughter   What type of home do you live in: 1 story or 2 story? one       Social Drivers of Corporate investment banker Strain: Low Risk  (04/12/2024)   Overall Financial Resource Strain (CARDIA)    Difficulty of Paying Living Expenses: Not hard at all  Food Insecurity: No Food Insecurity (03/09/2024)   Hunger Vital Sign    Worried About Running Out of Food in the Last Year: Never true    Ran Out of Food in the Last Year: Never true  Transportation Needs: No Transportation Needs (04/12/2024)   PRAPARE - Administrator, Civil Service (Medical): No    Lack of Transportation (Non-Medical): No  Physical Activity: Inactive (04/12/2024)   Exercise Vital Sign    Days of Exercise per Week: 0 days    Minutes of Exercise per Session: 0 min  Stress: No Stress Concern Present (04/12/2024)   [employer] of Occupational Health - Occupational Stress Questionnaire    Feeling of Stress: Only a little  Recent Concern: Stress - Stress Concern Present (03/09/2024)   [employer] of Occupational Health - Occupational Stress Questionnaire    Feeling of Stress : Rather much  Social Connections: Socially Isolated (04/12/2024)   Social Connection and Isolation Panel    Frequency of Communication with Friends and Family: Once a week    Frequency of Social Gatherings with Friends and Family: Never    Attends Religious Services: Never    Restaurant manager, fast food: No    Attends Engineer, structural: Never    Marital Status: Divorced    Tobacco Counseling Counseling given: Not Answered Tobacco comments: Quit more than 20 years ago    Clinical Intake:  Pre-visit preparation completed: Yes  Pain : No/denies pain     BMI - recorded: 22.24 Nutritional Status: BMI of 19-24  Normal Nutritional Risks: None Diabetes: Yes CBG done?: Yes (BS 180 this am at home) CBG resulted in Enter/ Edit results?: No Did pt. bring in CBG monitor from home?: No  Lab Results  Component Value Date   HGBA1C 9 01/18/2024   HGBA1C 8.7 09/16/2023   HGBA1C 8.2 07/10/2021  How often do you need to have someone help you when you read instructions, pamphlets, or other written materials from your doctor or pharmacy?: 1 - Never  Interpreter Needed?: No  Comments: daughter lives with pt Information entered by :: B.Dimarco Minkin,LPN   Activities of Daily Living     04/12/2024    2:13 PM  In your present state of health, do you have any difficulty performing the following activities:  Hearing? 0  Difficulty concentrating or making decisions? 1  Walking or climbing stairs? 1  Dressing or bathing? 0  Doing errands, shopping? 1  Preparing Food and eating ? N  Using the Toilet? N  In the past six months, have you accidently leaked urine? N  Do you have problems with loss of bowel control? N  Managing your Medications? Y  Managing your Finances? Y  Housekeeping or managing your Housekeeping? Y    Patient Care Team: Gretta Comer POUR, NP as PCP - General (Internal Medicine) Cherilyn Debby CROME, MD as Consulting Physician (Endocrinology) Vernetta Berg, MD as Consulting Physician (General Surgery) [facility]  I have updated your Care Teams any recent Medical Services you may have received from other providers in the past year.     Assessment:   This is a routine wellness examination for  Brooke Morse.  Hearing/Vision screen Hearing Screening - Comments:: Pt says her hearing is good Vision Screening - Comments:: Pt says her vision is good; readers only [facility]-   Goals Addressed             This Visit's Progress    Patient Stated       No goals       Depression Screen     04/12/2024    1:53 PM 03/10/2024   11:30 AM 04/12/2023    2:39 PM 12/03/2022   10:44 AM 10/14/2022   11:19 AM 04/08/2022    2:02 PM 03/05/2022   11:39 AM  PHQ 2/9 Scores  PHQ - 2 Score 0 4 0 0 2 0 0  PHQ- 9 Score  10  0 6 0 3    Fall Risk     04/12/2024    1:50 PM 03/17/2024   11:33 AM 03/10/2024   11:30 AM 12/16/2023   12:59 PM 10/22/2023   11:46 AM  Fall Risk   Falls in the past year? 1 1 1 1 1   Number falls in past yr: 1 1 1 1  0  Injury with Fall? 0 1 1 0 0  Risk for fall due to : No Fall Risks  History of fall(s);Impaired balance/gait;Impaired mobility  Impaired balance/gait;Impaired mobility;History of fall(s)  Follow up Education provided;Falls prevention discussed Falls evaluation completed Falls evaluation completed Falls evaluation completed Falls evaluation completed    MEDICARE RISK AT HOME:  Medicare Risk at Home Any stairs in or around the home?: No If so, are there any without handrails?: Yes Home free of loose throw rugs in walkways, pet beds, electrical cords, etc?: Yes Adequate lighting in your home to reduce risk of falls?: Yes Life alert?: Yes Use of a cane, walker or w/c?: Yes Grab bars in the bathroom?: Yes Shower chair or bench in shower?: Yes Elevated toilet seat or a handicapped toilet?: Yes  TIMED UP AND GO:  Was the test performed?  No  Cognitive Function: 6CIT completed    10/14/2022   11:07 AM 01/20/2018   11:25 AM  MMSE - Mini Mental State Exam  Orientation to time  5 5  Orientation to Place 5 5  Registration 3 3  Attention/ Calculation 5 0  Recall 2 0  Recall-comments  unable to recall 3 of 3 words  Language- name 2 objects 2 0  Language-  repeat 1 1  Language- follow 3 step command 3 3  Language- read & follow direction 1 0  Write a sentence 1 0  Copy design 1 0  Total score 29 17        04/12/2024    1:56 PM 04/12/2023    2:44 PM 04/08/2022    2:06 PM  6CIT Screen  What Year? 4 points 0 points 0 points  What month? 0 points 0 points 0 points  What time? 3 points 0 points 0 points  Count back from 20 4 points 0 points 0 points  Months in reverse 4 points 4 points 0 points  Repeat phrase 2 points 2 points 0 points  Total Score 17 points 6 points 0 points    Immunizations Immunization History  Administered Date(s) Administered   Pneumococcal Polysaccharide-23 01/07/2015    Screening Tests Health Maintenance  Topic Date Due   Zoster Vaccines- Shingrix (1 of 2) Never done   Pneumococcal Vaccine: 50+ Years (2 of 2 - PCV) 01/07/2016   FOOT EXAM  07/10/2022   INFLUENZA VACCINE  05/05/2024   Diabetic kidney evaluation - Urine ACR  05/30/2024   HEMOGLOBIN A1C  07/19/2024   OPHTHALMOLOGY EXAM  11/04/2024   Diabetic kidney evaluation - eGFR measurement  03/15/2025   Medicare Annual Wellness (AWV)  04/12/2025   DEXA SCAN  Completed   Hepatitis C Screening  Completed   Hepatitis B Vaccines  Aged Out   HPV VACCINES  Aged Out   Meningococcal B Vaccine  Aged Out   DTaP/Tdap/Td  Discontinued   Colonoscopy  Discontinued   COVID-19 Vaccine  Discontinued    Health Maintenance  Health Maintenance Due  Topic Date Due   Zoster Vaccines- Shingrix (1 of 2) Never done   Pneumococcal Vaccine: 50+ Years (2 of 2 - PCV) 01/07/2016   FOOT EXAM  07/10/2022   Health Maintenance Items Addressed: None at this time  Additional Screening:  Vision Screening: Recommended annual ophthalmology exams for early detection of glaucoma and other disorders of the eye. Would you like a referral to an eye doctor? No    Dental Screening: Recommended annual dental exams for proper oral hygiene  Community Resource Referral / Chronic Care  Management: CRR required this visit?  No   CCM required this visit?  Nodeclines to schedule at this time   Plan:    I have personally reviewed and noted the following in the patient's chart:   Medical and social history Use of alcohol, tobacco or illicit drugs  Current medications and supplements including opioid prescriptions. Patient is currently taking opioid prescriptions. Information provided to patient regarding non-opioid alternatives. Patient advised to discuss non-opioid treatment plan with their provider. Functional ability and status Nutritional status Physical activity Advanced directives List of other physicians Hospitalizations, surgeries, and ER visits in previous 12 months Vitals Screenings to include cognitive, depression, and falls Referrals and appointments  In addition, I have reviewed and discussed with patient certain preventive protocols, quality metrics, and best practice recommendations. A written personalized care plan for preventive services as well as general preventive health recommendations were provided to patient.   Brooke LITTIE Saris, LPN   2/0/7974   After Visit Summary: (MyChart) Due to this being a telephonic  visit, the after visit summary with patients personalized plan was offered to patient via MyChart   Notes: Nothing significant to report at this time.

## 2024-04-12 NOTE — Telephone Encounter (Signed)
 Left message advising Brooke Morse to refax the plan of care as we have not received anything in the last couple days.

## 2024-04-12 NOTE — Telephone Encounter (Signed)
 Copied from CRM [id number]. Topic: General - Other >> Apr 12, 2024 11:49 AM Chiquita SQUIBB wrote: Reason for CRM: [facility] is calling in to follow up on the plan of care that was faxed over needing signed by the provider, please provide with an update. Please contact back at [phone]. The order number is [id number]

## 2024-04-14 ENCOUNTER — Ambulatory Visit: Admitting: Physical Therapy

## 2024-04-17 ENCOUNTER — Other Ambulatory Visit: Payer: Self-pay | Admitting: Surgery

## 2024-04-17 DIAGNOSIS — K8020 Calculus of gallbladder without cholecystitis without obstruction: Secondary | ICD-10-CM | POA: Diagnosis not present

## 2024-04-17 DIAGNOSIS — Z794 Long term (current) use of insulin: Secondary | ICD-10-CM

## 2024-04-18 ENCOUNTER — Ambulatory Visit: Admitting: Physical Therapy

## 2024-04-18 ENCOUNTER — Encounter: Payer: Self-pay | Admitting: Neurology

## 2024-04-19 NOTE — Progress Notes (Signed)
 Referring Physician:  Leigh Venetia CROME, MD [address] [place],  [place] [address]  Primary Physician:  Brooke Comer POUR, NP  History of Present Illness: 04/24/2024 Brooke Morse is here today with a chief complaint of progressive gait abnormalities, progressive cognitive abnormalities and pain.  She states that she has been having significant number of falls.  She also has had some issues with incontinence.  Because of this she was worked up for normal pressure hydrocephalus with a lumbar drain/lumbar drainage trial.  She did not have any significant improvement in her symptomatology with spinal fluid drainage.  She is here today as she was found to have significant cervical stenosis.  She does have neck pain.  She gets pain that radiates down into her lower extremities.  conservative measures:  Physical therapy:  has participated in PT-1 time with [facility] and HH PT with [facility] reached out but has missed her appointments so far? Multimodal medical therapy including regular antiinflammatories:  Tramadol , Tylenol  Injections:  no epidural steroid injections  Past Surgery: none  The symptoms are causing a significant impact on the patient's life.   I have utilized the care everywhere function in epic to review the outside records available from external health systems.  Review of Systems:  A 10 point review of systems is negative, except for the pertinent positives and negatives detailed in the HPI.  Past Medical History: Past Medical History:  Diagnosis Date   Diabetes mellitus without complication (HCC)    Hyperlipemia     Past Surgical History: Past Surgical History:  Procedure Laterality Date   bunion removal     COLONOSCOPY WITH PROPOFOL  N/A 03/18/2015   Procedure: COLONOSCOPY WITH PROPOFOL ;  Surgeon: Brooke CINDERELLA Piedmont, MD;  Location: [facility] ENDOSCOPY;  Service: Gastroenterology;  Laterality: N/A;    Allergies: Allergies as of 04/24/2024 - Review Complete  04/12/2024  Allergen Reaction Noted   Peanut-containing drug products Itching 04/04/2022    Medications:  Current Outpatient Medications:    aspirin 81 MG tablet, Take 81 mg by mouth. About 2 times a week (Patient not taking: Reported on 10/22/2023), Disp: , Rfl:    atorvastatin (LIPITOR) 20 MG tablet, Take 20 mg by mouth. 2 a week (Patient not taking: Reported on 12/16/2023), Disp: , Rfl:    carbidopa -levodopa  (SINEMET  IR) 25-100 MG tablet, Take 1 tablet by mouth 3 (three) times daily., Disp: 90 tablet, Rfl: 5   Continuous Glucose Sensor (DEXCOM G7 SENSOR) MISC, USE 1 EACH EVERY 10 DAYS, Disp: , Rfl:    Fiber, Guar Gum, CHEW, Chew 1 tablet by mouth daily., Disp: , Rfl:    insulin lispro (HUMALOG) 100 UNIT/ML KiwkPen, Inject 12 Units as directed 3 (three) times daily., Disp: , Rfl:    Insulin Pen Needle (FIFTY50 PEN NEEDLES) 32G X 4 MM MISC, USE FOUR TIMES A DAY AS DIRECTED., Disp: , Rfl:    Lancets (ONETOUCH DELICA PLUS LANCET33G) MISC, USE TO TEST BLOOD SUGAR FOUR TIMES DAILY AS INSTRUCTED, Disp: , Rfl:    LANTUS SOLOSTAR 100 UNIT/ML Solostar Pen, Inject 8 Units into the skin daily., Disp: , Rfl:    ONETOUCH ULTRA test strip, , Disp: , Rfl:    traMADol  (ULTRAM ) 50 MG tablet, 1-2 tablets every 6 hours as needed, Disp: 30 tablet, Rfl: 0  Social History: Social History   Tobacco Use   Smoking status: Former    Types: Cigarettes   Smokeless tobacco: Never   Tobacco comments:  Quit more than 20 years ago  Vaping Use   Vaping status: Never Used  Substance Use Topics   Alcohol use: No   Drug use: No    Family Medical History: Family History  Problem Relation Age of Onset   Colon cancer Father    Asthma Father     Physical Examination:   NEUROLOGICAL:     Patient does demonstrate some intrinsic hand weakness bilaterally.  She also has some bilateral hip flexor weakness.  Otherwise shows some mild wasting.  She does seem to have some hyperreflexia noted with crossed  adductors, spreading of her patellar reflexes.  She does not have any clear clonus.  Her upper extremity examination shows some possible Hoffmann's and spreading of the brachial radialis reflex especially on the right.  Imaging: She has a significant amount of cervical stenosis and cervical spondylosis.  The is a reversal of her cervical lordosis.  At the lowest level there is a significant disc herniation causing compression of her spinal cord.  I have personally reviewed the images and agree with the above interpretation.  Medical Decision Making/Assessment and Plan: Brooke Morse is a pleasant 78 y.o. female with a complicated presentation.  She has a history of memory issues, incontinence, and ambulatory issues, was worked up for possible normal pressure hydrocephalus given ventriculomegaly, however did not have an improvement with lumbar drain trial.  She is here today as there part of the workup showed significant cervical stenosis with loss of lordosis and cervical disc disease.  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  Her gait abnormality as well as some of her hyperreflexia as well as pain and weakness could be contributed from her neck.  This would not identify any of the issues with her cranial symptoms including her cognitive decline.  I did ask both her and her family member whether or not she would even consider going forward with a cervical spinal fusion type procedure to treat this and she was quite hesitant.  She like to know what the procedure would entail.  In order to give her a better idea would like to get cervical flexion-extension x-rays to evaluate her deformity and whether or not she has any hypermobility noted.  We discussed that a intervention to realign her spine as well as decompress multiple levels would be major and require multiple hardware implants.  She like to know whether or not there is a smaller procedure we could do.  Some argument to discuss  whether or not decompressing the tumor index levels would be indicated to give her some decompression understanding that this would not help her lack of alignment.  Will plan on following up with her once her x-rays are performed \  Thank you for involving me in the care of this patient.    Brooke Morse Sharps MD/MSCR Neurosurgery

## 2024-04-20 ENCOUNTER — Ambulatory Visit: Admitting: Neurology

## 2024-04-20 ENCOUNTER — Encounter: Payer: Self-pay | Admitting: Neurology

## 2024-04-20 VITALS — BP 126/72 | HR 73

## 2024-04-20 DIAGNOSIS — G629 Polyneuropathy, unspecified: Secondary | ICD-10-CM | POA: Diagnosis not present

## 2024-04-20 DIAGNOSIS — M5412 Radiculopathy, cervical region: Secondary | ICD-10-CM | POA: Diagnosis not present

## 2024-04-20 DIAGNOSIS — R2689 Other abnormalities of gait and mobility: Secondary | ICD-10-CM

## 2024-04-20 DIAGNOSIS — R29898 Other symptoms and signs involving the musculoskeletal system: Secondary | ICD-10-CM

## 2024-04-20 DIAGNOSIS — R413 Other amnesia: Secondary | ICD-10-CM

## 2024-04-20 DIAGNOSIS — M4802 Spinal stenosis, cervical region: Secondary | ICD-10-CM | POA: Diagnosis not present

## 2024-04-20 DIAGNOSIS — R296 Repeated falls: Secondary | ICD-10-CM

## 2024-04-20 NOTE — Patient Instructions (Signed)
 For now, let's hold off on Sinemet  since you aren't currently taking it.  You see neurosurgery next week, so I will reach out and discuss with them if there is concern about your tight neck causing your symptoms. After I discuss with them, I will call to discuss Sinemet .  We also discussed possible memory testing, if you want that, in the future.  Please let me know if you have any questions or concerns in the meantime.  The physicians and staff at [facility] are committed to providing excellent care. You may receive a survey requesting feedback about your experience at our office. We strive to receive very good responses to the survey questions. If you feel that your experience would prevent you from giving the office a very good  response, please contact our office to try to remedy the situation. We may be reached at [phone]. Thank you for taking the time out of your busy day to complete the survey.  Venetia Potters, MD [facility]   Preventing Falls at [facility] are common, often dreaded events in the lives of older people. Aside from the obvious injuries and even death that may result, fall can cause wide-ranging consequences including loss of independence, mental decline, decreased activity and mobility. Younger people are also at risk of falling, especially those with chronic illnesses and fatigue.  Ways to reduce risk for falling Examine diet and medications. Warm foods and alcohol dilate blood vessels, which can lead to dizziness when standing. Sleep aids, antidepressants and pain medications can also increase the likelihood of a fall.  Get a vision exam. Poor vision, cataracts and glaucoma increase the chances of falling.  Check foot gear. Shoes should fit snugly and have a sturdy, nonskid sole and a broad, low heel  Participate in a physician-approved exercise program to build and maintain muscle strength and improve balance and coordination. Programs that use  ankle weights or stretch bands are excellent for muscle-strengthening. Water aerobics programs and low-impact Tai Chi programs have also been shown to improve balance and coordination.  Increase vitamin D intake. Vitamin D improves muscle strength and increases the amount of calcium the body is able to absorb and deposit in bones.  How to prevent falls from common hazards Floors - Remove all loose wires, cords, and throw rugs. Minimize clutter. Make sure rugs are anchored and smooth. Keep furniture in its usual place.  Chairs -- Use chairs with straight backs, armrests and firm seats. Add firm cushions to existing pieces to add height.  Bathroom - Install grab bars and non-skid tape in the tub or shower. Use a bathtub transfer bench or a shower chair with a back support Use an elevated toilet seat and/or safety rails to assist standing from a low surface. Do not use towel racks or bathroom tissue holders to help you stand.  Lighting - Make sure halls, stairways, and entrances are well-lit. Install a night light in your bathroom or hallway. Make sure there is a light switch at the top and bottom of the staircase. Turn lights on if you get up in the middle of the night. Make sure lamps or light switches are within reach of the bed if you have to get up during the night.  Kitchen - Install non-skid rubber mats near the sink and stove. Clean spills immediately. Store frequently used utensils, pots, pans between waist and eye level. This helps prevent reaching and bending. Sit when getting things out of lower cupboards.  Living room/  Bedrooms - Place furniture with wide spaces in between, giving enough room to move around. Establish a route through the living room that gives you something to hold onto as you walk.  Stairs - Make sure treads, rails, and rugs are secure. Install a rail on both sides of the stairs. If stairs are a threat, it might be helpful to arrange most of your activities on the lower  level to reduce the number of times you must climb the stairs.  Entrances and doorways - Install metal handles on the walls adjacent to the doorknobs of all doors to make it more secure as you travel through the doorway.  Tips for maintaining balance Keep at least one hand free at all times. Try using a backpack or fanny pack to hold things rather than carrying them in your hands. Never carry objects in both hands when walking as this interferes with keeping your balance.  Attempt to swing both arms from front to back while walking. This might require a conscious effort if Parkinson's disease has diminished your movement. It will, however, help you to maintain balance and posture, and reduce fatigue.  Consciously lift your feet off of the ground when walking. Shuffling and dragging of the feet is a common culprit in losing your balance.  When trying to navigate turns, use a U technique of facing forward and making a wide turn, rather than pivoting sharply.  Try to stand with your feet shoulder-length apart. When your feet are close together for any length of time, you increase your risk of losing your balance and falling.  Do one thing at a time. Don't try to walk and accomplish another task, such as reading or looking around. The decrease in your automatic reflexes complicates motor function, so the less distraction, the better.  Do not wear rubber or gripping soled shoes, they might catch on the floor and cause tripping.  Move slowly when changing positions. Use deliberate, concentrated movements and, if needed, use a grab bar or walking aid. Count 15 seconds between each movement. For example, when rising from a seated position, wait 15 seconds after standing to begin walking.  If balance is a continuous problem, you might want to consider a walking aid such as a cane, walking stick, or walker. Once you've mastered walking with help, you might be ready to try it on your own again.

## 2024-04-21 ENCOUNTER — Ambulatory Visit: Admitting: Physical Therapy

## 2024-04-24 ENCOUNTER — Encounter: Payer: Self-pay | Admitting: Neurosurgery

## 2024-04-24 ENCOUNTER — Ambulatory Visit
Admission: RE | Admit: 2024-04-24 | Discharge: 2024-04-24 | Disposition: A | Discharge status: Home / Self Care | Attending: Neurosurgery | Admitting: Neurosurgery

## 2024-04-24 ENCOUNTER — Ambulatory Visit: Admitting: Neurosurgery

## 2024-04-24 ENCOUNTER — Ambulatory Visit
Admission: RE | Admit: 2024-04-24 | Discharge: 2024-04-24 | Disposition: A | Discharge status: Home / Self Care | Source: Ambulatory Visit | Attending: Neurosurgery | Admitting: Neurosurgery

## 2024-04-24 VITALS — BP 144/86 | Ht 67.0 in | Wt 142.0 lb

## 2024-04-24 DIAGNOSIS — M5000 Cervical disc disorder with myelopathy, unspecified cervical region: Secondary | ICD-10-CM | POA: Diagnosis not present

## 2024-04-24 DIAGNOSIS — M4802 Spinal stenosis, cervical region: Secondary | ICD-10-CM | POA: Diagnosis not present

## 2024-04-24 DIAGNOSIS — R292 Abnormal reflex: Secondary | ICD-10-CM | POA: Diagnosis not present

## 2024-04-24 DIAGNOSIS — R2681 Unsteadiness on feet: Secondary | ICD-10-CM

## 2024-04-24 DIAGNOSIS — M5030 Other cervical disc degeneration, unspecified cervical region: Secondary | ICD-10-CM | POA: Diagnosis not present

## 2024-04-24 DIAGNOSIS — R296 Repeated falls: Secondary | ICD-10-CM

## 2024-04-24 DIAGNOSIS — M47812 Spondylosis without myelopathy or radiculopathy, cervical region: Secondary | ICD-10-CM | POA: Diagnosis not present

## 2024-04-25 ENCOUNTER — Encounter: Payer: Self-pay | Admitting: Neurology

## 2024-04-25 ENCOUNTER — Ambulatory Visit: Admitting: Physical Therapy

## 2024-04-26 ENCOUNTER — Encounter: Payer: Self-pay | Admitting: Neurosurgery

## 2024-04-28 ENCOUNTER — Ambulatory Visit: Admitting: Physical Therapy

## 2024-05-02 ENCOUNTER — Ambulatory Visit: Admitting: Physical Therapy

## 2024-05-02 ENCOUNTER — Telehealth: Payer: Self-pay | Admitting: Primary Care

## 2024-05-02 NOTE — Telephone Encounter (Signed)
 Copied from CRM [id number]. Topic: General - Other >> May 02, 2024  2:42 PM Jasmin G wrote: Reason for CRM: Mrs.Tonya from [facility] called due to pt missing appts for last 4 weeks, she informed that at this time pt will be discharged from clinic, call back if needed

## 2024-05-03 NOTE — Telephone Encounter (Signed)
 Noted

## 2024-05-04 ENCOUNTER — Ambulatory Visit: Payer: Self-pay | Admitting: Neurosurgery

## 2024-05-05 ENCOUNTER — Ambulatory Visit: Admitting: Neurosurgery

## 2024-05-05 ENCOUNTER — Ambulatory Visit: Admitting: Physical Therapy

## 2024-05-05 DIAGNOSIS — R2681 Unsteadiness on feet: Secondary | ICD-10-CM

## 2024-05-05 DIAGNOSIS — M4712 Other spondylosis with myelopathy, cervical region: Secondary | ICD-10-CM

## 2024-05-05 DIAGNOSIS — M4802 Spinal stenosis, cervical region: Secondary | ICD-10-CM | POA: Diagnosis not present

## 2024-05-05 NOTE — Progress Notes (Signed)
 I did follow-up phone call today with the patient and her daughter.  They were at home and I was in the office.  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  They gave consent to go forward with a phone visit  We discussed that when I reviewed her case with Dr. Leigh that she has multifactorial issues currently.  Her cognitive decline is likely related to age and has been monitored.  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  Her symptom presentation is consistent with cervical myelopathy although this does not explain all of her symptoms.  Its likely a part of her presentation overall.  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  Patient would like to discuss this further with her family.  If they decide to go forward with surgery we will plan on a two-level anterior cervical discectomy and fusion.  This would be at the C5-C7 level.  We did discuss that she has further areas of degeneration, however including these and a decompression would increase the complexity and risk of the surgery.  We discussed that we would focus on the highest levels of compression that are likely causative of her cervical myelopathy.  They will plan on calling back to the let us  know their plans.  Spent a total of 15 minutes on the phone call.  Penne MICAEL Sharps, MD

## 2024-05-08 ENCOUNTER — Telehealth: Payer: Self-pay | Admitting: Neurosurgery

## 2024-05-08 NOTE — Telephone Encounter (Signed)
 Patient's daughter called in wanting to make sure we received the message that was sent via mychart  Hi Dr Claudene. This is Aeron, [place] daughter. We talked it over this weekend and would like to proceed with next steps for getting the neck surgery. Please tell us  what's next.

## 2024-05-09 DIAGNOSIS — M4712 Other spondylosis with myelopathy, cervical region: Secondary | ICD-10-CM | POA: Insufficient documentation

## 2024-05-09 DIAGNOSIS — M4802 Spinal stenosis, cervical region: Secondary | ICD-10-CM | POA: Insufficient documentation

## 2024-05-09 NOTE — Telephone Encounter (Signed)
 Telephone appointment scheduled with Dr Claudene to discuss.

## 2024-05-09 NOTE — Telephone Encounter (Signed)
 Patient added on for 3:45 and aware of appt.

## 2024-05-10 ENCOUNTER — Ambulatory Visit (INDEPENDENT_AMBULATORY_CARE_PROVIDER_SITE_OTHER): Admitting: Neurosurgery

## 2024-05-10 DIAGNOSIS — R2681 Unsteadiness on feet: Secondary | ICD-10-CM

## 2024-05-10 DIAGNOSIS — M4712 Other spondylosis with myelopathy, cervical region: Secondary | ICD-10-CM

## 2024-05-10 DIAGNOSIS — M4802 Spinal stenosis, cervical region: Secondary | ICD-10-CM

## 2024-05-10 DIAGNOSIS — R296 Repeated falls: Secondary | ICD-10-CM

## 2024-05-10 NOTE — Progress Notes (Signed)
 I do follow-up phone visit today with Brooke Morse and her daughter.  They were at home and I was in the office.  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  I discussed tha that I had discussed her case with Dr. Leigh.  We know that some of her symptoms are due to myelopathy.  This is likely some of her issues with her ambulatory status.  It does not however explain her cognitive issues as well as her other symptoms.  I let them know that if we would plan to move forward with her cervical decompression and fusion it would be mostly for spinal cord pathology and not for any other pathology.  That she would have some setback with undergoing anesthesia and a procedure, but that this would be mostly focused on helping her maintain or potentially improve her ability to ambulate.  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  I do not think she would tolerate a 4 level procedure well from a anterior approach.  Family agreed.  She would like to go forward with the 2 areas of the worst concern.  Will plan for a C5-C7 anterior cervical discectomy and fusion.  She will need to have clearance from medicine and her medical team.  She will likely need extensive physical and occupational therapy after surgery.  Spent a total of 10 minutes on the phone call today.

## 2024-05-11 ENCOUNTER — Telehealth: Payer: Self-pay

## 2024-05-11 DIAGNOSIS — M4712 Other spondylosis with myelopathy, cervical region: Secondary | ICD-10-CM

## 2024-05-11 DIAGNOSIS — R2681 Unsteadiness on feet: Secondary | ICD-10-CM

## 2024-05-11 NOTE — Telephone Encounter (Signed)
-----   Message from Penne LELON Sharps sent at 05/10/2024  5:33 PM EDT ----- Regarding: posting sheet Neurosurgery Booking Request - Dr. Penne Sharps  Patient Name: Brooke Morse DOB: 1945-12-04 MRN: [id number] Sex: female Phone Number(s): [phone] (home)  Interpreter Needed: No Language:   Surgeon: Penne Sharps, MD Assist: Per Protocol Preop Diagnosis: Cervical spondylosis with myelopathy, gait abnormality, Requested Surgery Date/Time: Per KJ  Surgery Location: [facility] Patient Class: Observation in bed Anesthesia Type: General  Requested Length of Surgery:Per Protocol  Laterality: N/A Positioning: Supine, Berchtold Surgical Procedure: C5-C7 anterior cervical discectomy and fusion Special Case Needs (equipment, implants, sets): [employer] Scope,Carm Brainlab: No Pathology Needed: No  Rep Needed: Yes Rep/Company Name: [employer] Indication for Consent: Cervical spondylosis with myelopathy, gait abnormality CPT Codes: 77448, 77447, 20931, 22845 Monitoring Required: Yes Brace Required: No Type of Brace: No Order Faxed to [employer]:   K[facility] Check List Clearance needed: General Medical Pre-admit Office Visit Date/Time:  Patient Notified:  Case Booked By:  Date Booked:  Orders Placed:  Epic Referral Created:  Auth Documented in Auth/Cert:  Notes on Schedule:  Documented/Highlighted on Calendar:  Booking Number:

## 2024-05-11 NOTE — Telephone Encounter (Signed)
 RE: posting sheet Received: Today Claudene Penne ORN, MD  Keaten Mashek, RN We should probably do a A1c on her, she is at a superhigh risk for complications and I would not want to push it       Previous Messages    ----- Message ----- From: Calianne Larue, RN Sent: 05/11/2024  11:06 AM EDT To: Penne ORN Claudene, MD Subject: RE: posting sheet                              A1c was 9.0 in April. Do you want a repeat A1c at her pre-op appointment? Or no b/c she has myelopathy?

## 2024-05-15 NOTE — Telephone Encounter (Addendum)
 Sent surgery clearance form to PCP. Will order A1c at PAT appointment unless PCP orders A1c prior to then. Still waiting on patient to choose date.

## 2024-05-22 ENCOUNTER — Other Ambulatory Visit: Payer: Self-pay

## 2024-05-22 DIAGNOSIS — Z01818 Encounter for other preprocedural examination: Secondary | ICD-10-CM

## 2024-05-22 DIAGNOSIS — R2681 Unsteadiness on feet: Secondary | ICD-10-CM

## 2024-05-22 DIAGNOSIS — M4712 Other spondylosis with myelopathy, cervical region: Secondary | ICD-10-CM

## 2024-05-22 NOTE — Telephone Encounter (Signed)
 SABRA

## 2024-05-22 NOTE — Telephone Encounter (Signed)
 Planned surgery: C5-7 anterior cervical discectomy and fusion   Surgery date: 06/13/24 at [facility]: [address], [place], [place] [address]) - you will find out your arrival time the business day before your surgery.   Pre-op appointment at [facility] Pre-admit Testing: you will receive a call with a date/time for this appointment. If you are scheduled for an in person appointment, Pre-admit Testing is located on the first floor of the [facility] building, [address], [address]. During this appointment, they will advise you which medications you can take the morning of surgery, and which medications you will need to hold for surgery. Labs (such as blood work, EKG) may be done at your pre-op appointment. You are not required to fast for these labs. Should you need to change your pre-op appointment, please call Pre-admit testing at [phone].      Surgical clearance: we will send a clearance form to Comer Gaskins, NP. They may wish to see you in their office prior to signing the clearance form. If so, they may call you to schedule an appointment.   A1c: Per Dr Theressa request, I placed an order for a repeat A1c to be done as part of her labs at her pre-op appointment. She isn't required to, but if Comer does this test as part of her clearance work up prior to her pre-op appointment, please let me know and I will cancel the order I placed so she doesn't have it done twice within a month.     NSAIDS (Non-steroidal anti-inflammatory drugs): because you are having a fusion, please avoid taking any NSAIDS (examples: ibuprofen, motrin, aleve, naproxen, meloxicam, diclofenac) for 3 months after surgery. Celebrex is an exception and is OK to take, if prescribed. Tylenol  is not an NSAID.    Common restrictions after spine surgery: No bending, lifting, or twisting ("BLT"). Avoid lifting objects heavier than 10 pounds for the first 6  weeks after surgery. Where possible, avoid household activities that involve lifting, bending, reaching, pushing, or pulling such as laundry, vacuuming, grocery shopping, and childcare. Try to arrange for help from friends and family for these activities while you heal. Do not drive while taking prescription pain medication. Weeks 6 through 12 after surgery: avoid lifting more than 25 pounds.    X-rays after surgery: Because you are having a fusion: for appointments after your 2 week follow-up: please arrive at the [facility] outpatient imaging center ([address], [place]) or [facility] one hour prior to your appointment for x-rays. This applies to every appointment after your 2 week follow-up. Failure to do so may result in your appointment being rescheduled. *We recently started construction to have x-ray in our office. This may be completed by the time you come in for your 6 week post-op appointment. Please check with us  closer to that time to see if you can have your x-rays at our office*    How to contact us :  If you have any questions/concerns before or after surgery, you can reach us  at [phone], or you can send a mychart message. We can be reached by phone or mychart 8am-4pm, Monday-Friday.  *Please note: Calls after 4pm are forwarded to a third party answering service. Mychart messages are not routinely monitored during evenings, weekends, and holidays. Please call our office to contact the answering service for urgent concerns during non-business hours.   If you have FMLA/disability paperwork, please drop it off  or fax it to [phone]   Appointments/FMLA & disability paperwork: [employer] Registered Nurse/Surgery scheduler: Teren Franckowiak, RN Certified Medical Assistants: Don, CMA, Elenor, CMA, & Damien, CMA Physician Assistants: Lyle Decamp, PA-C, Edsel Goods, PA-C & Glade Boys, PA-C Surgeons: Penne Sharps, MD & Reeves Daisy, MD   [facility] PREADMIT TESTING VISIT and SURGERY INFORMATION SHEET   Now that surgery has been scheduled you can anticipate several phone calls from [facility] services. A pharmacy technician will call you to verify your current list of medications taken at home.               The Pre-Service Center will call to verify your insurance information and to give you billing estimates and information.             The Preadmit Testing Office will be calling to schedule a visit to obtain information for the anesthesia team and provide instructions on preparation for surgery.  What can you expect for the Preadmit Testing Visit: Appointments may be scheduled in-person or by telephone.  If a telephone visit is scheduled, you may be asked to come into the office to have lab tests or other studies performed.   This visit will not be completed any greater than 14 days prior to your surgery.  If your surgery has been scheduled for a future date, please do not be alarmed if we have not contacted you to schedule an appointment more than a month prior to the surgery date.    Please be prepared to provide the following information during this appointment:            -Personal medical history                                               -Medication and allergy list            -Any history of problems with anesthesia              -Recent lab work or diagnostic studies            -Please notify us  of any needs we should be aware of to provide the best care possible           -You will be provided with instructions on how to prepare for your surgery.    On The Day of Surgery:  You must have a driver to take you home after surgery, you will be asked not to drive for 24 hours following surgery.  Taxi, Gisele and non-medical transport will not be acceptable means of transportation unless you have a responsible individual who will be traveling with you.  Visitors in the surgical  area:   2 people will be able to visit you in your room once your preparation for surgery has been completed. During surgery, your visitors will be asked to wait in the Surgery Waiting Area.  It is not a requirement for them to stay, if they prefer to leave and come back.  Your visitor(s) will be given an update once the surgery has been completed.  No visitors are allowed in the initial recovery room to respect patient privacy and safety.  Once you are more awake and transfer to the secondary recovery area, or are transferred to an inpatient room, visitors will again be able to see you.  To respect and protect your privacy: We will ask on the day of surgery who your driver will be and what the contact number for that individual will be. We will ask if it is okay to share information with this individual, or if there is an alternative individual that we, or the surgeon, should contact to provide updates and information. If family or friends come to the surgical information desk requesting information about you, who you have not listed with us , no information will be given.   It may be helpful to designate someone as the main contact who will be responsible for updating your other friends and family.    PREADMIT TESTING OFFICE: [phone] SAME DAY SURGERY: [phone] We look forward to caring for you before and throughout the process of your surgery.

## 2024-05-24 DIAGNOSIS — E10649 Type 1 diabetes mellitus with hypoglycemia without coma: Secondary | ICD-10-CM | POA: Diagnosis not present

## 2024-05-24 DIAGNOSIS — E785 Hyperlipidemia, unspecified: Secondary | ICD-10-CM | POA: Diagnosis not present

## 2024-05-24 DIAGNOSIS — E1069 Type 1 diabetes mellitus with other specified complication: Secondary | ICD-10-CM | POA: Diagnosis not present

## 2024-05-24 NOTE — Telephone Encounter (Signed)
 Kelli, can you get the surgical clearance form from this patient's surgeon? I'm not sure what the phone number is.

## 2024-05-24 NOTE — Progress Notes (Addendum)
 HPI: Brooke Morse is a 78 y.o. 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  I also see her daughter Kandra [id number]) for her thyroid .  She has been on insulin since that time.  I last saw her in 4/25. At that time, I increased her lantus and cut her Humalog.  She is accompanied by her daughter today.   She is currently Lantus 10 units at night and Humalog 2 units with meals: If sugar is under 100 before the meal, skip the humalog If sugar is 100-200, just take the 2 units If sugar is over 200, add extra to the 2 units If sugar is high, add extra insulin: If 200-250 add 1 units (dose will be 3) If 251-300 add 2 units (dose will be 4) If 301-350 add 3 units (dose will be 5) If 351-400 add 4 units (dose will be 6) If over 400 add 5 units (dose will be 7)  She uses the Dexcom for CGM. It was downloaded and reviewed.  Her 14 day average is 200 with SD of 68.  Her TIR is 40%.  She tends to go down overnight when she is not eating and go up during the day when she does eat.  Review of her diet shows that she avoids concentrated carbohydrates.  She walks some for exercise.  She is up 1 pound since last visit.  She denies lower extremity pain/burning.  She is concerned about her diabetes.     ROS:  No chest pain.  No shortness of breath.  Medical History: Past Medical History:  Diagnosis Date  . Benign colon polyp 03/18/2015  . Diabetes mellitus type 2, uncomplicated (CMS/HHS-HCC)   . History of being hospitalized 02/2010   Gallstone pancreatitis, complicated by pancreatitis abscesses.  . Hyperlipidemia   . Hypertension     Surgical History: Past Surgical History:  Procedure Laterality Date  . COLONOSCOPY  03/18/2015   Negative biopsy/PHx of polyps/Repeat 8yrs  . Bilateral Bunionectomy  2014, 2015  . LAPAROSCOPIC TUBAL LIGATION      Social History:  reports that she quit smoking about 42  years ago. Her smoking use included cigarettes. She started smoking about 57 years ago. She has never used smokeless tobacco. She reports that she does not drink alcohol and does not use drugs.  Family History: family history includes Colon cancer in her father; Diabetes type II in her maternal aunt; High blood pressure (Hypertension) in her mother; No Known Problems in her brother, brother, and sister.  Medications: Current Outpatient Medications  Medication Sig Dispense Refill  . blood glucose diagnostic (ONETOUCH ULTRA TEST) test strip USE 1 STRIP TO CHECK GLUCOSE 4 TIMES DAILY AS DIRECTED 100 each 11  . blood-glucose sensor (DEXCOM G7 SENSOR) Devi USE 1 EACH EVERY 10 DAYS 9 each 3  . HUMALOG KWIKPEN INSULIN pen injector (concentration 100 units/mL) INJECT 15 UNITS SUBCUTANEOUSLY THREE TIMES DAILY PER DA BASED ON GOOD AND GLUCOSE 15 mL 11  . LANTUS SOLOSTAR U-100 INSULIN pen injector (concentration 100 units/mL) Start 8 units once a day.  Titrate to fasting sugar below 130.  Max daily dose 50 units 15 mL 6  . multivitamin with iron-minerals (SUPER THERA VITE M) tablet Take 1 tablet by mouth once daily    . pen needle, diabetic (BD ULTRA-FINE NANO PEN NEEDLE) 32 gauge x 5/32 Ndle USE FOUR TIMES A DAY AS DIRECTED. 400 each 3  .  atorvastatin (LIPITOR) 20 MG tablet TAKE 1 TABLET BY MOUTH TWICE A WEEK (Patient not taking: Reported on 12/28/2023) 24 tablet 4  . carbidopa -levodopa  (SINEMET ) 25-100 mg tablet Take 0.5 tablets by mouth 3 (three) times daily (Patient not taking: Reported on 05/24/2024)    . cholecalciferol (VITAMIN D3) 2,000 unit tablet Take 6,000 Units by mouth once daily    . insulin pump cart,auto,BT,G6/7 (OMNIPOD 5 G6-G7 PODS, GEN 5,) Crtg Inject 1 each subcutaneously every third day 10 each 12  . VITAMIN D3 ORAL Take by mouth once daily     No current facility-administered medications for this visit.    Allergies: Allergies  Allergen Reactions  . Peanut Itching    Physical  Exam: Vitals:   05/24/24 1428  BP: 132/70  Pulse: 60  SpO2: 99%  Weight: 69.1 kg (152 lb 6.4 oz)  Height: 170.2 cm (5' 7)    Body mass index is 23.87 kg/m. Gen:  WDWN in NAD   Physical exam otherwise deferred due to coronavirus precautions.  Labs: 10/20/2016: A1c = 7.5.  Cholesterol = 168/46/72.8/86 04/13/2017: A1c = 7.7.  K/Cr/Ca = 4.5/0.7/9.7.  Microalbumin <7. 11/05/2017: A1c = 7.3 11/09/2018: A1c = 7.4.  K/Cr/Ca=4.8/0.7/9.5.  MA<7.  Chol=190/77/69/106.  LFTs nl.  TSH=1.213.  10/23/2019: A1c = 7.0.  K/Cr/Ca = 5.0/0.7/9.9.  Cholesterol = 179/58/71.4/96.  LFTs normal.  TSH = 1.652. 04/23/2020: A1c = 7.7  11/19/2020: A1c = 7.6.  K/Cr/Ca = 4.8/0.7/9.7.  Microalbumin = 14.7.  Cholesterol = 182/68/78.2/90.  LFTs normal.  TSH = 1.433 07/10/2021:  A1c = 8.2.  03/05/2022:  K/Cr/Ca = 5.0/0.81/9.7.  Chol = 228/57/71.4/145.  LFTs nl.  TSH = 0.74.  04/20/2022:  A1c = 8.0 05/19/2022:  Chol = 213/71/72.4/126.  B12 = 239 (211-911)  10/20/2022:  A1c = 8.8 05/13/2023:  A1c=9.0 05/31/2023:  K/Cr/Ca= 4.6/0.7/9.6.  MA=10.9.  Chol=241/77/77.6/148.  LFTs nl.  TSH=0.908.  09/16/2023:  A1c = 8.7  10/22/2023:  K/Cr/Ca=4.7/0.72/9.3.  LFTs nl except for ALT/AST= 114/46.  11/23/2023:  LFTs nl.  12/16/2023:  B12=459.  01/18/2024:  A1c=9.0.  03/15/2024: K/Cr/Ca = 4.1/066/9.3.  LFTs nl.  05/24/2024: A1c = 8.5.  Assessment/Plan: 1.  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  Her A1c today is 8.5. Based on review of her CGM, I suggested starting her on the OP5. We discussed it in detail.  I sent an rx.  I will plan to give her a free starter kit.  I told her to contact the rep to set up for training. I encouraged lifestyle modifications.  When we start, I will have set her BR to 0.6, IC=15, ISF=50.  Will pre set snacks (7g), small (15), medium (30), large (45). 2.  Hyperlipidemia. I'm checking lipids today.  I will likely restart her on atorvastatin. 3.  History of hypoglycemia.  Hopefully the automated  system will keep her hypoglycemia to a minimum.  4.  Elevated blood pressure.  Her blood pressure is good today.  She is not on medication.   5.  Prophylaxis. I will plan a foot exam next visit.   She saw the eye doctor [facility]) on 11/05/2023.  She had a negative exam.  She is to f/u in 1/26.   6.  She will return to clinic in 3-4 months.    Addendum:  05/24/2024 : K/Cr/Ca = 4.6/0.7/9.5.  Cholesterol = 249/151/68.6/150.  LFTs normal.  TSH = 1.155.  Results sent via MyChart.  Asked her to restart a atorvastatin  When get form  for OP5 settings (above), make sure have free starter kit for her.  07/12/24:  Filled out op 5 orders.  Have starter kit with her name on it.  07/27/24:  Pts daughter picked up OP5 starter kit.  Asked for vials to be sent.  Sent humalog  This note is partially prepared by Earla Daria Messier, Scribe, in the presence of and acting as the scribe of Dr. Debby Breaker , MD.    [facility] [place], MD

## 2024-06-06 ENCOUNTER — Encounter
Admission: RE | Admit: 2024-06-06 | Discharge: 2024-06-06 | Disposition: A | Discharge status: Home / Self Care | Source: Ambulatory Visit | Attending: Neurosurgery | Admitting: Neurosurgery

## 2024-06-06 ENCOUNTER — Other Ambulatory Visit: Payer: Self-pay

## 2024-06-06 VITALS — BP 137/78 | HR 67 | Resp 16 | Wt 147.3 lb

## 2024-06-06 DIAGNOSIS — I10 Essential (primary) hypertension: Secondary | ICD-10-CM | POA: Insufficient documentation

## 2024-06-06 DIAGNOSIS — Z01810 Encounter for preprocedural cardiovascular examination: Secondary | ICD-10-CM | POA: Diagnosis not present

## 2024-06-06 DIAGNOSIS — Z01818 Encounter for other preprocedural examination: Secondary | ICD-10-CM | POA: Insufficient documentation

## 2024-06-06 DIAGNOSIS — E109 Type 1 diabetes mellitus without complications: Secondary | ICD-10-CM | POA: Diagnosis not present

## 2024-06-06 DIAGNOSIS — Z794 Long term (current) use of insulin: Secondary | ICD-10-CM | POA: Insufficient documentation

## 2024-06-06 DIAGNOSIS — Z01812 Encounter for preprocedural laboratory examination: Secondary | ICD-10-CM

## 2024-06-06 HISTORY — DX: Unspecified osteoarthritis, unspecified site: M19.90

## 2024-06-06 HISTORY — DX: Anxiety disorder, unspecified: F41.9

## 2024-06-06 HISTORY — DX: Anemia, unspecified: D64.9

## 2024-06-06 HISTORY — DX: Essential (primary) hypertension: I10

## 2024-06-06 HISTORY — DX: Acute pancreatitis without necrosis or infection, unspecified: K85.90

## 2024-06-06 LAB — CBC
HCT: 43.5 % (ref 36.0–46.0)
Hemoglobin: 13.6 g/dL (ref 12.0–15.0)
MCH: 26 pg (ref 26.0–34.0)
MCHC: 31.3 g/dL (ref 30.0–36.0)
MCV: 83.2 fL (ref 80.0–100.0)
Platelets: 187 K/uL (ref 150–400)
RBC: 5.23 MIL/uL — ABNORMAL HIGH (ref 3.87–5.11)
RDW: 15.1 % (ref 11.5–15.5)
WBC: 4.2 K/uL (ref 4.0–10.5)
nRBC: 0 % (ref 0.0–0.2)

## 2024-06-06 LAB — TYPE AND SCREEN
ABO/RH(D): O POS
Antibody Screen: NEGATIVE

## 2024-06-06 LAB — SURGICAL PCR SCREEN
MRSA, PCR: NEGATIVE
Staphylococcus aureus: NEGATIVE

## 2024-06-06 NOTE — Patient Instructions (Addendum)
 Your procedure is scheduled on: 06/13/24 - Tuesday Report to the Registration Desk on the 1st floor of the [facility]. To find out your arrival time, please call [phone] between 1PM - 3PM on: 06/12/24 - Monday If your arrival time is 6:00 am, do not arrive before that time as the [facility] entrance doors do not open until 6:00 am.  REMEMBER: Instructions that are not followed completely may result in serious medical risk, up to and including death; or upon the discretion of your surgeon and anesthesiologist your surgery may need to be rescheduled.  Do not eat food after midnight the night before surgery.  No gum chewing or hard candies.  You may however, drink CLEAR liquids up to 2 hours before you are scheduled to arrive for your surgery. Do not drink anything within 2 hours of your scheduled arrival time.  Clear liquids include: - water   You may continue if needed on up until your surgery,  Anti-inflammatories (NSAIDS) such as Advil, Aleve, Ibuprofen, Motrin, Naproxen, Naprosyn and Aspirin based products such as Excedrin, Goody's Powder, BC Powder.  NSAIDS (Non-steroidal anti-inflammatory drugs): because you are having a fusion, please avoid taking any NSAIDS (examples: ibuprofen, motrin, aleve, naproxen, meloxicam, diclofenac) for 3 months after surgery. Celebrex is an exception and is OK to take, if prescribed. Tylenol  is not an NSAID. You may continue to take Tylenol  if needed for pain up until the day of surgery.  Stop ANY OVER THE COUNTER supplements until after surgery : Multiple Vitamin , vitamin k ,VITAMIN D3   LANTUS SOLOSTAR - inject 1/2 of your prescribed dose of insulin on the night before your surgery.  HUMALOG hold on the morning of surgery.  ON THE DAY OF SURGERY ONLY TAKE THESE MEDICATIONS WITH SIPS OF WATER:  none   No Alcohol for 24 hours before or after surgery.  No Smoking including e-cigarettes for 24 hours before surgery.  No chewable tobacco  products for at least 6 hours before surgery.  No nicotine patches on the day of surgery.  Do not use any recreational drugs for at least a week (preferably 2 weeks) before your surgery.  Please be advised that the combination of cocaine and anesthesia may have negative outcomes, up to and including death. If you test positive for cocaine, your surgery will be cancelled.  On the morning of surgery brush your teeth with toothpaste and water, you may rinse your mouth with mouthwash if you wish. Do not swallow any toothpaste or mouthwash.  Use CHG Soap or wipes as directed on instruction sheet.  Do not wear jewelry, make-up, hairpins, clips or nail polish.  For welded (permanent) jewelry: bracelets, anklets, waist bands, etc.  Please have this removed prior to surgery.  If it is not removed, there is a chance that hospital personnel will need to cut it off on the day of surgery.  Do not wear lotions, powders, or perfumes.   Do not shave body hair from the neck down 48 hours before surgery.  Contact lenses, hearing aids and dentures may not be worn into surgery.  Do not bring valuables to the hospital. [facility] is not responsible for any missing/lost belongings or valuables.   Notify your doctor if there is any change in your medical condition (cold, fever, infection).  Wear comfortable clothing (specific to your surgery type) to the hospital.  After surgery, you can help prevent lung complications by doing breathing exercises.  Take deep breaths and cough every  1-2 hours. Your doctor may order a device called an Incentive Spirometer to help you take deep breaths.  When coughing or sneezing, hold a pillow firmly against your incision with both hands. This is called "splinting." Doing this helps protect your incision. It also decreases belly discomfort.  If you are being admitted to the hospital overnight, leave your suitcase in the car. After surgery it may be brought to your  room.  In case of increased patient census, it may be necessary for you, the patient, to continue your postoperative care in the Same Day Surgery department.  If you are being discharged the day of surgery, you will not be allowed to drive home. You will need a responsible individual to drive you home and stay with you for 24 hours after surgery.   If you are taking public transportation, you will need to have a responsible individual with you.  Please call the Pre-admissions Testing Dept. at [phone] if you have any questions about these instructions.  Surgery Visitation Policy:  Patients having surgery or a procedure may have two visitors.  Children under the age of 4 must have an adult with them who is not the patient.  Inpatient Visitation:    Visiting hours are 7 a.m. to 8 p.m. Up to four visitors are allowed at one time in a patient room. The visitors may rotate out with other people during the day.  One visitor age 16 or older may stay with the patient overnight and must be in the room by 8 p.m.   Merchandiser, retail to address health-related social needs:  [email or web address]    Pre-operative 5 CHG Bath Instructions   You can play a key role in reducing the risk of infection after surgery. Your skin needs to be as free of germs as possible. You can reduce the number of germs on your skin by washing with CHG (chlorhexidine gluconate) soap before surgery. CHG is an antiseptic soap that kills germs and continues to kill germs even after washing.   DO NOT use if you have an allergy to chlorhexidine/CHG or antibacterial soaps. If your skin becomes reddened or irritated, stop using the CHG and notify one of our RNs at [phone].   Please shower with the CHG soap starting 4 days before surgery using the following schedule: 09/05 - 09/09.    Please keep in mind the following:  DO NOT shave, including legs and underarms, starting the day of your  first shower.   You may shave your face at any point before/day of surgery.  Place clean sheets on your bed the day you start using CHG soap. Use a clean washcloth (not used since being washed) for each shower. DO NOT sleep with pets once you start using the CHG.   CHG Shower Instructions:  If you choose to wash your hair and private area, wash first with your normal shampoo/soap.  After you use shampoo/soap, rinse your hair and body thoroughly to remove shampoo/soap residue.  Turn the water OFF and apply about 3 tablespoons (45 ml) of CHG soap to a CLEAN washcloth.  Apply CHG soap ONLY FROM YOUR NECK DOWN TO YOUR TOES (washing for 3-5 minutes)  DO NOT use CHG soap on face, private areas, open wounds, or sores.  Pay special attention to the area where your surgery is being performed.  If you are having back surgery, having someone wash your back for you may be helpful. Wait 2 minutes after CHG soap is  applied, then you may rinse off the CHG soap.  Pat dry with a clean towel  Put on clean clothes/pajamas   If you choose to wear lotion, please use ONLY the CHG-compatible lotions on the back of this paper.     Additional instructions for the day of surgery: DO NOT APPLY any lotions, deodorants, cologne, or perfumes.   Put on clean/comfortable clothes.  Brush your teeth.  Ask your nurse before applying any prescription medications to the skin.      CHG Compatible Lotions   Aveeno Moisturizing lotion  Cetaphil Moisturizing Cream  Cetaphil Moisturizing Lotion  Clairol Herbal Essence Moisturizing Lotion, Dry Skin  Clairol Herbal Essence Moisturizing Lotion, Extra Dry Skin  Clairol Herbal Essence Moisturizing Lotion, Normal Skin  Curel Age Defying Therapeutic Moisturizing Lotion with Alpha Hydroxy  Curel Extreme Care Body Lotion  Curel Soothing Hands Moisturizing Hand Lotion  Curel Therapeutic Moisturizing Cream, Fragrance-Free  Curel Therapeutic Moisturizing Lotion, Fragrance-Free   Curel Therapeutic Moisturizing Lotion, Original Formula  Eucerin Daily Replenishing Lotion  Eucerin Dry Skin Therapy Plus Alpha Hydroxy Crme  Eucerin Dry Skin Therapy Plus Alpha Hydroxy Lotion  Eucerin Original Crme  Eucerin Original Lotion  Eucerin Plus Crme Eucerin Plus Lotion  Eucerin TriLipid Replenishing Lotion  Keri Anti-Bacterial Hand Lotion  Keri Deep Conditioning Original Lotion Dry Skin Formula Softly Scented  Keri Deep Conditioning Original Lotion, Fragrance Free Sensitive Skin Formula  Keri Lotion Fast Absorbing Fragrance Free Sensitive Skin Formula  Keri Lotion Fast Absorbing Softly Scented Dry Skin Formula  Keri Original Lotion  Keri Skin Renewal Lotion Keri Silky Smooth Lotion  Keri Silky Smooth Sensitive Skin Lotion  Nivea Body Creamy Conditioning Oil  Nivea Body Extra Enriched Lotion  Nivea Body Original Lotion  Nivea Body Sheer Moisturizing Lotion Nivea Crme  Nivea Skin Firming Lotion  NutraDerm 30 Skin Lotion  NutraDerm Skin Lotion  NutraDerm Therapeutic Skin Cream  NutraDerm Therapeutic Skin Lotion  ProShield Protective Hand Cream  Provon moisturizing lotion  How to Use an Incentive Spirometer  An incentive spirometer is a tool that measures how well you are filling your lungs with each breath. Learning to take long, deep breaths using this tool can help you keep your lungs clear and active. This may help to reverse or lessen your chance of developing breathing (pulmonary) problems, especially infection. You may be asked to use a spirometer: After a surgery. If you have a lung problem or a history of smoking. After a long period of time when you have been unable to move or be active. If the spirometer includes an indicator to show the highest number that you have reached, your health care provider or respiratory therapist will help you set a goal. Keep a log of your progress as told by your health care provider. What are the risks? Breathing too quickly may  cause dizziness or cause you to pass out. Take your time so you do not get dizzy or light-headed. If you are in pain, you may need to take pain medicine before doing incentive spirometry. It is harder to take a deep breath if you are having pain. How to use your incentive spirometer  Sit up on the edge of your bed or on a chair. Hold the incentive spirometer so that it is in an upright position. Before you use the spirometer, breathe out normally. Place the mouthpiece in your mouth. Make sure your lips are closed tightly around it. Breathe in slowly and as deeply as you can through  your mouth, causing the piston or the ball to rise toward the top of the chamber. Hold your breath for 3-5 seconds, or for as long as possible. If the spirometer includes a coach indicator, use this to guide you in breathing. Slow down your breathing if the indicator goes above the marked areas. Remove the mouthpiece from your mouth and breathe out normally. The piston or ball will return to the bottom of the chamber. Rest for a few seconds, then repeat the steps 10 or more times. Take your time and take a few normal breaths between deep breaths so that you do not get dizzy or light-headed. Do this every 1-2 hours when you are awake. If the spirometer includes a goal marker to show the highest number you have reached (best effort), use this as a goal to work toward during each repetition. After each set of 10 deep breaths, cough a few times. This will help to make sure that your lungs are clear. If you have an incision on your chest or abdomen from surgery, place a pillow or a rolled-up towel firmly against the incision when you cough. This can help to reduce pain while taking deep breaths and coughing. General tips When you are able to get out of bed: Walk around often. Continue to take deep breaths and cough in order to clear your lungs. Keep using the incentive spirometer until your health care provider says it is  okay to stop using it. If you have been in the hospital, you may be told to keep using the spirometer at home. Contact a health care provider if: You are having difficulty using the spirometer. You have trouble using the spirometer as often as instructed. Your pain medicine is not giving enough relief for you to use the spirometer as told. You have a fever. Get help right away if: You develop shortness of breath. You develop a cough with bloody mucus from the lungs. You have fluid or blood coming from an incision site after you cough. Summary An incentive spirometer is a tool that can help you learn to take long, deep breaths to keep your lungs clear and active. You may be asked to use a spirometer after a surgery, if you have a lung problem or a history of smoking, or if you have been inactive for a long period of time. Use your incentive spirometer as instructed every 1-2 hours while you are awake. If you have an incision on your chest or abdomen, place a pillow or a rolled-up towel firmly against your incision when you cough. This will help to reduce pain. Get help right away if you have shortness of breath, you cough up bloody mucus, or blood comes from your incision when you cough. This information is not intended to replace advice given to you by your health care provider. Make sure you discuss any questions you have with your health care provider. Document Revised: 12/11/2019 Document Reviewed: 12/11/2019 Elsevier Patient Education  2023 [employer].

## 2024-06-13 ENCOUNTER — Ambulatory Visit: Payer: Self-pay | Admitting: Urgent Care

## 2024-06-13 ENCOUNTER — Ambulatory Visit: Payer: Self-pay

## 2024-06-13 ENCOUNTER — Ambulatory Visit
Admission: RE | Admit: 2024-06-13 | Discharge: 2024-06-13 | Disposition: A | Discharge status: Home / Self Care | Attending: Neurosurgery | Admitting: Neurosurgery

## 2024-06-13 ENCOUNTER — Encounter: Payer: Self-pay | Admitting: Neurosurgery

## 2024-06-13 ENCOUNTER — Encounter
Admission: RE | Disposition: A | Discharge status: Home / Self Care | Payer: Self-pay | Source: Home / Self Care | Attending: Neurosurgery

## 2024-06-13 ENCOUNTER — Other Ambulatory Visit: Payer: Self-pay

## 2024-06-13 DIAGNOSIS — M4712 Other spondylosis with myelopathy, cervical region: Secondary | ICD-10-CM | POA: Insufficient documentation

## 2024-06-13 DIAGNOSIS — Z539 Procedure and treatment not carried out, unspecified reason: Secondary | ICD-10-CM | POA: Diagnosis not present

## 2024-06-13 DIAGNOSIS — D649 Anemia, unspecified: Secondary | ICD-10-CM | POA: Diagnosis not present

## 2024-06-13 DIAGNOSIS — Z794 Long term (current) use of insulin: Secondary | ICD-10-CM

## 2024-06-13 DIAGNOSIS — E119 Type 2 diabetes mellitus without complications: Secondary | ICD-10-CM | POA: Insufficient documentation

## 2024-06-13 DIAGNOSIS — R2681 Unsteadiness on feet: Secondary | ICD-10-CM | POA: Diagnosis not present

## 2024-06-13 DIAGNOSIS — Z01812 Encounter for preprocedural laboratory examination: Secondary | ICD-10-CM

## 2024-06-13 DIAGNOSIS — E785 Hyperlipidemia, unspecified: Secondary | ICD-10-CM | POA: Diagnosis not present

## 2024-06-13 DIAGNOSIS — Z87891 Personal history of nicotine dependence: Secondary | ICD-10-CM | POA: Insufficient documentation

## 2024-06-13 DIAGNOSIS — I10 Essential (primary) hypertension: Secondary | ICD-10-CM | POA: Insufficient documentation

## 2024-06-13 DIAGNOSIS — E109 Type 1 diabetes mellitus without complications: Secondary | ICD-10-CM

## 2024-06-13 DIAGNOSIS — Z01818 Encounter for other preprocedural examination: Secondary | ICD-10-CM

## 2024-06-13 LAB — HEMOGLOBIN A1C
Hgb A1c MFr Bld: 7.8 % — ABNORMAL HIGH (ref 4.8–5.6)
Mean Plasma Glucose: 177.16 mg/dL

## 2024-06-13 LAB — ABO/RH: ABO/RH(D): O POS

## 2024-06-13 LAB — GLUCOSE, CAPILLARY: Glucose-Capillary: 194 mg/dL — ABNORMAL HIGH (ref 70–99)

## 2024-06-13 SURGERY — ANTERIOR CERVICAL DECOMPRESSION/DISCECTOMY FUSION 2 LEVELS
Anesthesia: General

## 2024-06-13 MED ORDER — CEFAZOLIN SODIUM-DEXTROSE 2-4 GM/100ML-% IV SOLN: 2.0000 g | INTRAVENOUS | Status: DC

## 2024-06-13 MED ORDER — ORAL CARE MOUTH RINSE: 15.0000 mL | Freq: Once | OROMUCOSAL | Status: AC

## 2024-06-13 MED ORDER — ENSURE PRE-SURGERY PO LIQD
296.0000 mL | Freq: Once | ORAL | Status: DC
  Filled 2024-06-13: qty 296

## 2024-06-13 MED ORDER — CHLORHEXIDINE GLUCONATE CLOTH 2 % EX PADS
6.0000 | MEDICATED_PAD | Freq: Once | CUTANEOUS | Status: AC
Start: 2024-06-13 — End: 2024-06-13
  Administered 2024-06-13: 6 via TOPICAL

## 2024-06-13 MED ORDER — CEFAZOLIN IN SODIUM CHLORIDE 2-0.9 GM/100ML-% IV SOLN
2.0000 g | Freq: Once | INTRAVENOUS | Status: DC
  Filled 2024-06-13: qty 100

## 2024-06-13 MED ORDER — CHLORHEXIDINE GLUCONATE 0.12 % MT SOLN
OROMUCOSAL | Status: AC
  Filled 2024-06-13: qty 15

## 2024-06-13 MED ORDER — CEFAZOLIN SODIUM-DEXTROSE 2-4 GM/100ML-% IV SOLN
INTRAVENOUS | Status: AC
Start: 2024-06-13 — End: 2024-06-13
  Filled 2024-06-13: qty 100

## 2024-06-13 MED ORDER — SODIUM CHLORIDE 0.9 % IV SOLN: INTRAVENOUS | Status: DC

## 2024-06-13 MED ORDER — ACETAMINOPHEN 500 MG PO TABS: 1000.0000 mg | ORAL_TABLET | ORAL | Status: DC

## 2024-06-13 MED ORDER — CHLORHEXIDINE GLUCONATE 0.12 % MT SOLN
15.0000 mL | Freq: Once | OROMUCOSAL | Status: AC
  Administered 2024-06-13: 15 mL via OROMUCOSAL

## 2024-06-13 MED ORDER — CHLORHEXIDINE GLUCONATE CLOTH 2 % EX PADS: 6.0000 | MEDICATED_PAD | Freq: Once | CUTANEOUS | Status: DC

## 2024-06-13 NOTE — Progress Notes (Signed)
 After discussing the procedure with Dr. Claudene, the patient decided not to proceed with the surgery. Pt IV removed and belonging returned. Pt dressed and taken in wheelchair by daughter.

## 2024-06-13 NOTE — Discharge Instructions (Signed)

## 2024-06-13 NOTE — Progress Notes (Signed)
 I was able to sit down with Ms. Iacovelli, her daughter and son this afternoon to discuss the case.  She was asked pressing some concerns for the planned procedure.  We discussed that the indication for procedure would be to help decompress her spinal cord in the hopes that this helps with her ambulatory issues.  We did explain to her that her presentation does not clear-cut as she does have multiple other neurologic issues including her parkinsonism, hydrocephalus, and cervical spondylosis.  I did explain to her that postoperatively she would likely have neck pain for at least the next 6 to 12 weeks while she was healing, and she is quite concerned as she does have no neck or arm pain.  Her main concern is her ambulation.  I did explain to her and her family that she has some stenosis on her spine which could be playing a role in her ambulation issues although there is no guarantee that she would have improvement in her ambulation after decompression.  The goal would be to keep her from getting worse from the portion of her ambulation issues that are spinal cord mediated.  Patient expressed interest in continuing to follow and work with her physical therapist and personal trainers to see if she can get improvement in her ambulation.  I let her know that I be happy to follow her over the the next few months to see whether or not she starts to develop more clear cut symptoms of cervical myelopathy or if her symptoms continue to worsen.  I did explain to them with cervical stenosis that she is at increased risk for spinal cord injury if she has a trauma or fall with neck trauma.  They acknowledged understanding.  They would like to follow-up in 3 to 4 months to see whether or not she has any progression in her ambulatory difficulties or if her presentation becomes more clear.  Will plan to cancel the procedure for today and follow-up with her in clinic in 3 to 4 months.

## 2024-06-17 NOTE — H&P (Signed)
 Update note written separately. Patient decided against going forward with surgery due to concerns for outcomes/pain.

## 2024-06-28 ENCOUNTER — Encounter: Admitting: Physician Assistant

## 2024-07-18 ENCOUNTER — Encounter: Payer: Self-pay | Admitting: Pharmacist

## 2024-07-18 NOTE — Progress Notes (Signed)
 Pharmacy Quality Measure Review  Statin Use in Persons with Diabetes (SUPD)  Patient has filled several prescriptions for atorvastatin 20 mg in 2025.  She is expected to pass SUPD measure for 2025.  No further action needed at this time.

## 2024-07-24 ENCOUNTER — Encounter: Admitting: Neurosurgery

## 2024-08-10 NOTE — Progress Notes (Unsigned)
 NEUROLOGY FOLLOW UP OFFICE NOTE  Brooke Morse [id number]  Subjective:  Brooke Morse is a 78 y.o. 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  To briefly review: 12/16/23: Patient has problems with her balance. She thinks this has been going on about 1 year. She has to hold on to things when walking. She has fallen about 3 times since 10/06/23. She thinks it is due to leg weakness. She will usually will lower herself down due to the leg weakness. There seems to be core weakness too as she can have difficulty sitting up. She has been working with a systems analyst 3 times per week for about 5 months and there has been little improvement. She has a rollator that she uses 90% of the time. Daughter sees coordination as the biggest issue with using the rollator.    She denies numbness or tingling in legs or feet. She denies symptoms in her arms. She denies neck or back pain.   She has good sense of smell. She denies feeling frozen. Daughter does wonder though because when she loses her balance sometimes she does not take a step. She denies tremors. There is no concern for REM sleep disorder.   She does endorse some short term memory problems for which she was previously on memantine  5 mg BID. She stopped all of her medications except insulin for DM. She is able to take care of herself and handles her own finances. She lives with her daughter.   She sleeps about 5-6 hours per night. She takes a lot of naps during the day.  Per daughter her sleep hygiene could be better.   She has lost 10-15 lbs, mostly due to lack of appetite. She denies feeling depressed or down. She denies fevers or night sweats.    report any constitutional symptoms like fever, night sweats, anorexia or unintentional weight loss.   EtOH use: None  Restrictive diet? No Family history of neuropathy/myopathy/neurologic disease? No    I started a Sinemet  trial (1/2 tablet TID) on 12/16/23.   03/17/24: Per my phone note on 01/11/24: Called and spoke with daughter. Patient is the same as prior. There has been no difference with 1/2 tablet of sinemet  TID thus far. Patient is still having memory issues and imbalance with one fall in bathroom since last clinic visit. She does not have any significant urinary incontinence.   I discussed the results of the MRI brain. It showed significant atrophy. In my opinion, her ventriculomegaly is not out of proportion to atrophy, but concern was raised by radiology about NPH. She does have cognitive changes and gait abnormality, so clinically, this may be a consideration.   I discussed the options with patient's daughter: Increase Sinemet  to 1 tablet TID to monitor for response Pursue lumbar puncture and PT evaluation of gait before and after for NPH Continue with current treatment and monitor   Daughter will discuss with patient and get back to me on any questions or what they would like to do.   They decided to go forward with LP, which was normal. PT and patient noted no change in symptoms (gait or cognition) after LP. NPH is unlikely. I recommended patient increase Sinemet  to 1 tablet TID. Daughter is not sure this helped. She has been throwing up a lot due to gallbladder per daughter. Daughter is concerned that this is keeping her from  keeping down medications, but this was only over the last couple of weeks.   Patient has been falling a lot. She states she feels like she has a swimming sensation then falls. She had to go to the ED on 03/15/24. She had been constipated and was weak over the prior week. CT of spine showed possible fracture at T12. She was given tramadol  and tylenol . She denies any significant pain now. She also thinks the dizziness has been better since ED visit.   She denies any significant pain today, including neck pain.  04/20/24: MRI cervical spine showed multilevel  spondylosis with mild to moderate spinal stenosis (C5-6 and C6-7) and severe left and moderate right C4 foraminal stenosis, severe right C5, and severe bilateral C6-7 foraminal stenosis. I recommended patient see neurosurgery for their opinion, but patient preferred to wait and discuss with me today. She does have an appointment with Dr. Claudene on 04/24/24 in [place].   There was also an issue with patient's Sinemet  where she was briefly given CR sinemet  by pharmacy instead of IR. This has been corrected by the pharmacy and patient now has Sinemet  IR. She is prescribed 1 tablet TID but not taking since mix up at pharmacy. Per daughter, she has not noticed a difference taking the medication.   Patient is feeling okay with no significant pain. She is using a rollator and not had any falls since last visit. Per daughter, she still moves very slowly and scared of falling. Daughter also mentions her cognition still seems off. Patient is not sure.   Most recent Assessment and Plan (04/20/24): This is Brooke Morse, a 78 y.o. female with imbalance and falls. She has increased tone and unsteady gait. There is some diminished sensation in her feet, likely from diabetic neuropathy, but her imbalance is out of proportion to this sensory loss. I was initially concerned about parkisonism, but patient has not significantly responded to Sinemet  (though only trialed 1 tablet TID), and now MRI cervical spine shows cervical stenosis with possible contact with cord. I wonder if this is the driving factor in her symptoms. She is seeing NSGY next week for their opinion. Patient has not been on Sinemet  recently, so I will have her continue to hold this until she sees NSGY.   Plan: -Hold off on Sinemet  until after patient sees NSGY next week. Will discuss with patient next week -See Dr. Claudene in NSGY as planned on 04/24/24 -May consider neuropsych testing in future if interested in further evaluation for cognitive  changes -Fall precautions discussed  Since their last visit: Patient saw Dr. Claudene on 04/24/24 as planned. Dr. Claudene offered patient surgery for myeloradiculopathy as this was felt to be part of her presentation, though obviously would not explain cognitive changes. Patient had decided to do surgery but changed her mind prior to surgery and did not get the surgery as planned (06/2024).  Danaka's daughter, Madyn Ivins, sent me a MyChart message with concerns prior to the visit so to not bring up in front of patient and frustrate her: Cognitive Observations: Continues to have difficulty adapting to changes in her environment or routine. For example, she increasingly operates the new washing machine as if it were the old one and requires step-by-step assistance each time. Transitioned from insulin pen to insulin pump a few weeks ago. Initially she had difficulty remembering her place within an eight-step written instruction. When the instructions were rewritten to show only the word she needed to tap (app on  phone communicates with insulin pump), she was able to complete the process independently. Has administered insulin but did not remember doing so. When shown proof from the pump's record, she acknowledged it, then later denied it resulting in heightened frustration (for me and her).  Has paid bills twice from her bank account on multiple occasions. Becomes frustrated when she notices. Requires frequent prompting to complete time-sensitive activities. She can't be left alone to, for example, prepare for leaving the house; she has to be prompted along the way.  Places items in unusual locations and becomes easily distracted while performing routine tasks. After completing daily routines such as eating or bathing, often pauses for extended periods and appears uncertain about what to do next. When asked, she states she is fine, though her expression and body language suggest otherwise. Communication and  Engagement: Does not initiate casual conversation. Often unable to answer questions that require explanation or extended thought. Stares at the television for long periods without reacting. Primarily watches YouTube and engages passively with whatever content appears next. No longer watches movies, reporting loss of interest before finishing and a preference for short-form content. Physical and Safety: Shuffles feet more when walking and does not consistently lift them. Has fallen twice since the last appointment and is unable to get up independently after a fall. Began personal training on 08/17/24. Frequently sits too far from the table when eating, leading to food and drink spills. ***  MEDICATIONS:  Outpatient Encounter Medications as of 08/18/2024  Medication Sig Note   Aspirin-Caffeine 845-65 MG PACK Take by mouth as needed. (Patient not taking: Reported on 06/13/2024)    atorvastatin (LIPITOR) 20 MG tablet Take 20 mg by mouth daily.    carbidopa -levodopa  (SINEMET  IR) 25-100 MG tablet Take 1 tablet by mouth 3 (three) times daily. (Patient not taking: Reported on 06/02/2024)    cholecalciferol (VITAMIN D3) 25 MCG (1000 UNIT) tablet Take 1,000 Units by mouth 3 (three) times a week.    Continuous Glucose Sensor (DEXCOM G7 SENSOR) MISC USE 1 EACH EVERY 10 DAYS    HUMALOG KWIKPEN 100 UNIT/ML KwikPen Inject 3-5 Units into the skin 3 (three) times daily with meals.    Insulin Pen Needle (FIFTY50 PEN NEEDLES) 32G X 4 MM MISC USE FOUR TIMES A DAY AS DIRECTED.    Lancets (ONETOUCH DELICA PLUS LANCET33G) MISC USE TO TEST BLOOD SUGAR FOUR TIMES DAILY AS INSTRUCTED    LANTUS SOLOSTAR 100 UNIT/ML Solostar Pen Inject 8 Units into the skin daily. 06/13/2024: 4 units   Multiple Vitamin (MULTIVITAMIN WITH MINERALS) TABS tablet Take 1 tablet by mouth daily.    ONETOUCH ULTRA test strip     traMADol  (ULTRAM ) 50 MG tablet 1-2 tablets every 6 hours as needed (Patient not taking: No sig reported)    vitamin k  100 MCG tablet Take 100 mcg by mouth 3 (three) times a week.    No facility-administered encounter medications on file as of 08/18/2024.    PAST MEDICAL HISTORY: Past Medical History:  Diagnosis Date   Anemia    Anxiety    Arthritis    Diabetes mellitus without complication (HCC)    Hyperlipemia    Hypertension    Pancreatitis    pancreatectomy pancreatitis    PAST SURGICAL HISTORY: Past Surgical History:  Procedure Laterality Date   bunion removal     COLONOSCOPY WITH PROPOFOL  N/A 03/18/2015   Procedure: COLONOSCOPY WITH PROPOFOL ;  Surgeon: Deward CINDERELLA Piedmont, MD;  Location: [facility] ENDOSCOPY;  Service: Gastroenterology;  Laterality: N/A;    ALLERGIES: Allergies  Allergen Reactions   Peanut-Containing Drug Products Itching    FAMILY HISTORY: Family History  Problem Relation Age of Onset   Colon cancer Father    Asthma Father     SOCIAL HISTORY: Social History   Tobacco Use   Smoking status: Former    Types: Cigarettes   Smokeless tobacco: Never   Tobacco comments:    Quit more than 20 years ago  Vaping Use   Vaping status: Never Used  Substance Use Topics   Alcohol use: No   Drug use: No   Social History   Social History Narrative   Divorced.   2 children, 5 grandchildren.   Retired. Once worked at a cigarette factory.   Enjoys sewing, cooking, walks 2 miles daily.    Are you right handed or left handed? Right   What is your current occupation?   Who lives with you? Daughter   What type of home do you live in: 1 story or 2 story? one          Objective:  Vital Signs:  There were no vitals taken for this visit.  ***  Labs and Imaging review: New results: 06/13/24: HbA1c: 7.8  CBC (06/06/24): unremarkable  External labs (05/24/24): Lipid panel: tChol 249, LDL 150, TG 151 TSH wnl CMP significant for glucose 178  Previously reviewed results: 12/16/23: [place]pper  wnl Vit E wnl B1 wnl B12: 459 IFE no M protein   CSF (01/17/24): opening pressure 15 cm  of H2O 7 RBC, 1 WBC, 35 protein, 103 glucose IgG index wnl Cytology wnl   HbA1c (01/18/24): 9   Hepatic function panel (11/23/23) unremarkable   10/22/23: CMP significant for glucose 321, AST 46, ALT 114   HbA1c (09/16/23): 8.7 B12 (05/19/22): 239   External labs: 05/31/23: TSH wnl Lipid panel: tChol 241, LDL 148, TG 77 HbA1c: 9   Imaging/Procedures: Lumbar spine xray (06/01/2007): IMPRESSION:  1. No fracture is seen.  2. There is a mild spondylolisthesis without spondylolysis at the L4-L5  level where there is also observed narrowing of the disc space compatible  disc disease. This could be further evaluated by MR if clinically  indicated.  2. There is a nonspecific calcific appearing density superior to the L4  lumbar transverse process on the RIGHT. As noted above this likely  represents a phlebolith but the possibility of a RIGHT ureteral stone  cannot  be entirely excluded from the differential and if such is a clinical  concern, CT examination utilizing stone protocol would be recommended.    MRI brain wo contrast (12/27/23): FINDINGS: Brain: Ventriculomegaly which is generalized with callosal angle narrowing and disproportionate subarachnoid spaces on coronal T2 weighted imaging. There is a degree of brain atrophy especially of the periatrial white matter with ventricular scalloping. FLAIR hyperintensity in the cerebral white matter, usually related to chronic small vessel ischemia. No cortical infarct, mass, or collection. Hypointense nodule along the left occipital horn which may be from remote hemorrhage or mineralization, nonspecific in isolation. Normal appearance of the cerebellum and brainstem.   Vascular: Major flow voids are preserved   Skull and upper cervical spine: Normal marrow signal.   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   Mild  chronic white matter disease.   No recent insult.  MRI cervical spine wo contrast (03/24/24): FINDINGS: Alignment: Reversal of the normal cervical lordosis, apex at C3-4. No significant  listhesis.   Vertebrae: Vertebral body height maintained without acute or chronic fracture. Bone marrow signal intensity within normal limits. No worrisome osseous lesions. No abnormal marrow edema.   Cord: Normal signal and morphology.   Posterior Fossa, vertebral arteries, paraspinal tissues: Partially empty sella noted. Craniocervical junction normal. Paraspinous soft tissues within normal limits. Normal flow voids seen within the vertebral arteries bilaterally.   Disc levels:   C2-C3: Small left paracentral disc protrusion mildly indents the left ventral thecal sac. Mild right-sided facet hypertrophy. No spinal stenosis. Foramina remain patent.   C3-C4: Degenerative disc space narrowing with diffuse disc osteophyte complex. Superimposed shallow central disc protrusion indents the ventral thecal sac. Mild cord flattening without cord signal changes. No significant spinal stenosis. Severe left with moderate right C4 foraminal stenosis.   C4-C5: Degenerative intervertebral disc space narrowing with circumferential disc osteophyte complex. Mild right-sided facet hypertrophy. No spinal stenosis. Severe right C5 foraminal narrowing. Left neural foramina remains patent.   C5-C6: Degenerative vertebral disc space narrowing with diffuse disc osteophyte complex. Flattening and partial effacement of the ventral thecal sac. Superimposed mild facet and ligament flavum hypertrophy. Resultant mild spinal stenosis. Severe left worse than right C6 foraminal narrowing.   C6-C7: Degenerative disc space narrowing with diffuse disc osteophyte complex, asymmetric to the left. Flattening and partial effacement of the ventral thecal sac. Secondary cord flattening without cord signal changes. Mild left facet  hypertrophy with ligament flavum thickening. Resultant severe left-sided spinal stenosis, with severe left worse than right C7 foraminal narrowing.   C7-T1: Negative interspace. Mild bilateral facet hypertrophy. No spinal stenosis. Foramina remain patent.   T1-2: Negative interspace. Mild facet hypertrophy. No significant stenosis.   T2-3: Small left paracentral to foraminal disc protrusion with moderate left foraminal stenosis.   IMPRESSION: 1. Multilevel cervical spondylosis with resultant mild to moderate spinal stenosis at C5-6 and C6-7. 2. Multifactorial degenerative changes with resultant multilevel foraminal narrowing as above. Notable findings include severe left with moderate right C4 foraminal stenosis, severe right C5 foraminal narrowing, severe left worse than right C6 and C7 foraminal stenosis. 3. Normal MRI appearance of the cervical spinal cord.  Assessment/Plan:  This is Nathanel LELON Molt, a 78 y.o. female with: ***   Plan: ***  Return to clinic in ***  Total time spent reviewing records, interview, history/exam, documentation, and coordination of care on day of encounter:  *** min  Venetia Potters, MD

## 2024-08-17 ENCOUNTER — Encounter: Payer: Self-pay | Admitting: Neurology

## 2024-08-18 ENCOUNTER — Ambulatory Visit: Admitting: Neurology

## 2024-08-18 ENCOUNTER — Encounter: Payer: Self-pay | Admitting: Neurology

## 2024-08-18 VITALS — BP 136/75 | HR 57 | Ht 67.0 in | Wt 150.0 lb

## 2024-08-18 DIAGNOSIS — M5412 Radiculopathy, cervical region: Secondary | ICD-10-CM | POA: Diagnosis not present

## 2024-08-18 DIAGNOSIS — M4802 Spinal stenosis, cervical region: Secondary | ICD-10-CM | POA: Diagnosis not present

## 2024-08-18 DIAGNOSIS — G629 Polyneuropathy, unspecified: Secondary | ICD-10-CM | POA: Diagnosis not present

## 2024-08-18 DIAGNOSIS — G20C Parkinsonism, unspecified: Secondary | ICD-10-CM | POA: Diagnosis not present

## 2024-08-18 DIAGNOSIS — R413 Other amnesia: Secondary | ICD-10-CM

## 2024-08-18 DIAGNOSIS — R2689 Other abnormalities of gait and mobility: Secondary | ICD-10-CM

## 2024-08-18 NOTE — Patient Instructions (Signed)
 We discussed movement and cognition today.  Regarding the movement, it could be slow due to your tight spinal cord in the neck, but it is still possible this is parkinsonism. We discussed we could do a special brain scan called DATSCAN or retry the Sinemet  medication.  Regarding cognition, we discussed doing further cognitive testing or try a medication like Aricept to try to slow down cognitive decline.  You would like to discuss this. Let me know if you want to do any of the above.  I will see you again in 6 months at least to keep an eye on you and your symptoms.  Please let me know if you have any questions or concerns in the meantime.  The physicians and staff at [facility] are committed to providing excellent care. You may receive a survey requesting feedback about your experience at our office. We strive to receive very good responses to the survey questions. If you feel that your experience would prevent you from giving the office a very good  response, please contact our office to try to remedy the situation. We may be reached at [phone]. Thank you for taking the time out of your busy day to complete the survey.  Brooke Potters, MD [facility]  Preventing Falls at [facility] are common, often dreaded events in the lives of older people. Aside from the obvious injuries and even death that may result, fall can cause wide-ranging consequences including loss of independence, mental decline, decreased activity and mobility. Younger people are also at risk of falling, especially those with chronic illnesses and fatigue.  Ways to reduce risk for falling Examine diet and medications. Warm foods and alcohol dilate blood vessels, which can lead to dizziness when standing. Sleep aids, antidepressants and pain medications can also increase the likelihood of a fall.  Get a vision exam. Poor vision, cataracts and glaucoma increase the chances of falling.  Check foot gear.  Shoes should fit snugly and have a sturdy, nonskid sole and a broad, low heel  Participate in a physician-approved exercise program to build and maintain muscle strength and improve balance and coordination. Programs that use ankle weights or stretch bands are excellent for muscle-strengthening. Water aerobics programs and low-impact Tai Chi programs have also been shown to improve balance and coordination.  Increase vitamin D intake. Vitamin D improves muscle strength and increases the amount of calcium the body is able to absorb and deposit in bones.  How to prevent falls from common hazards Floors - Remove all loose wires, cords, and throw rugs. Minimize clutter. Make sure rugs are anchored and smooth. Keep furniture in its usual place.  Chairs -- Use chairs with straight backs, armrests and firm seats. Add firm cushions to existing pieces to add height.  Bathroom - Install grab bars and non-skid tape in the tub or shower. Use a bathtub transfer bench or a shower chair with a back support Use an elevated toilet seat and/or safety rails to assist standing from a low surface. Do not use towel racks or bathroom tissue holders to help you stand.  Lighting - Make sure halls, stairways, and entrances are well-lit. Install a night light in your bathroom or hallway. Make sure there is a light switch at the top and bottom of the staircase. Turn lights on if you get up in the middle of the night. Make sure lamps or light switches are within reach of the bed if you have to get up during the night.  Kitchen - Install non-skid rubber mats near the sink and stove. Clean spills immediately. Store frequently used utensils, pots, pans between waist and eye level. This helps prevent reaching and bending. Sit when getting things out of lower cupboards.  Living room/ Bedrooms - Place furniture with wide spaces in between, giving enough room to move around. Establish a route through the living room that gives you  something to hold onto as you walk.  Stairs - Make sure treads, rails, and rugs are secure. Install a rail on both sides of the stairs. If stairs are a threat, it might be helpful to arrange most of your activities on the lower level to reduce the number of times you must climb the stairs.  Entrances and doorways - Install metal handles on the walls adjacent to the doorknobs of all doors to make it more secure as you travel through the doorway.  Tips for maintaining balance Keep at least one hand free at all times. Try using a backpack or fanny pack to hold things rather than carrying them in your hands. Never carry objects in both hands when walking as this interferes with keeping your balance.  Attempt to swing both arms from front to back while walking. This might require a conscious effort if Parkinson's disease has diminished your movement. It will, however, help you to maintain balance and posture, and reduce fatigue.  Consciously lift your feet off of the ground when walking. Shuffling and dragging of the feet is a common culprit in losing your balance.  When trying to navigate turns, use a U technique of facing forward and making a wide turn, rather than pivoting sharply.  Try to stand with your feet shoulder-length apart. When your feet are close together for any length of time, you increase your risk of losing your balance and falling.  Do one thing at a time. Don't try to walk and accomplish another task, such as reading or looking around. The decrease in your automatic reflexes complicates motor function, so the less distraction, the better.  Do not wear rubber or gripping soled shoes, they might catch on the floor and cause tripping.  Move slowly when changing positions. Use deliberate, concentrated movements and, if needed, use a grab bar or walking aid. Count 15 seconds between each movement. For example, when rising from a seated position, wait 15 seconds after standing to  begin walking.  If balance is a continuous problem, you might want to consider a walking aid such as a cane, walking stick, or walker. Once you've mastered walking with help, you might be ready to try it on your own again.

## 2024-09-04 ENCOUNTER — Encounter: Admitting: Physician Assistant

## 2024-09-14 DIAGNOSIS — E1069 Type 1 diabetes mellitus with other specified complication: Secondary | ICD-10-CM | POA: Diagnosis not present

## 2024-09-14 DIAGNOSIS — E10649 Type 1 diabetes mellitus with hypoglycemia without coma: Secondary | ICD-10-CM | POA: Diagnosis not present

## 2024-09-14 DIAGNOSIS — E785 Hyperlipidemia, unspecified: Secondary | ICD-10-CM | POA: Diagnosis not present

## 2024-10-19 LAB — OPHTHALMOLOGY REPORT-SCANNED

## 2025-02-16 ENCOUNTER — Ambulatory Visit: Admitting: Neurology

## 2025-04-13 ENCOUNTER — Ambulatory Visit
